# Patient Record
Sex: Male | Born: 1970 | Race: White | Hispanic: No | Smoking: Current every day smoker
Health system: Southern US, Community
[De-identification: ages and names within clinical notes are randomized; demographics above are authoritative.]

## PROBLEM LIST (undated history)

## (undated) DIAGNOSIS — E785 Hyperlipidemia, unspecified: Secondary | ICD-10-CM

## (undated) DIAGNOSIS — I219 Acute myocardial infarction, unspecified: Secondary | ICD-10-CM

## (undated) DIAGNOSIS — K219 Gastro-esophageal reflux disease without esophagitis: Secondary | ICD-10-CM

## (undated) HISTORY — PX: CORONARY ANGIOPLASTY WITH STENT PLACEMENT: SHX49

## (undated) HISTORY — PX: OTHER SURGICAL HISTORY: SHX169

## (undated) HISTORY — PX: URINARY SURGERY: SHX2626

---

## 2013-07-22 ENCOUNTER — Encounter: Payer: Self-pay | Admitting: Gastroenterology

## 2013-08-17 ENCOUNTER — Encounter: Payer: Self-pay | Admitting: Gastroenterology

## 2013-08-17 ENCOUNTER — Ambulatory Visit (INDEPENDENT_AMBULATORY_CARE_PROVIDER_SITE_OTHER): Payer: BC Managed Care – PPO | Admitting: Gastroenterology

## 2013-08-17 ENCOUNTER — Other Ambulatory Visit (INDEPENDENT_AMBULATORY_CARE_PROVIDER_SITE_OTHER): Payer: BC Managed Care – PPO

## 2013-08-17 VITALS — BP 120/60 | HR 88 | Ht 68.0 in | Wt 170.8 lb

## 2013-08-17 DIAGNOSIS — R112 Nausea with vomiting, unspecified: Secondary | ICD-10-CM

## 2013-08-17 LAB — CBC WITH DIFFERENTIAL/PLATELET
Basophils Absolute: 0 10*3/uL (ref 0.0–0.1)
Basophils Relative: 0.1 % (ref 0.0–3.0)
Eosinophils Relative: 1.7 % (ref 0.0–5.0)
Hemoglobin: 14.4 g/dL (ref 13.0–17.0)
Lymphocytes Relative: 28.5 % (ref 12.0–46.0)
Monocytes Relative: 5.6 % (ref 3.0–12.0)
Neutro Abs: 4.9 10*3/uL (ref 1.4–7.7)
RBC: 4.74 Mil/uL (ref 4.22–5.81)
WBC: 7.6 10*3/uL (ref 4.5–10.5)

## 2013-08-17 LAB — COMPREHENSIVE METABOLIC PANEL
ALT: 21 U/L (ref 0–53)
BUN: 15 mg/dL (ref 6–23)
CO2: 27 mEq/L (ref 19–32)
Calcium: 9.6 mg/dL (ref 8.4–10.5)
Chloride: 103 mEq/L (ref 96–112)
Creatinine, Ser: 0.9 mg/dL (ref 0.4–1.5)
GFR: 103.06 mL/min (ref >60.00)
Glucose, Bld: 106 mg/dL — ABNORMAL HIGH (ref 70–99)

## 2013-08-17 NOTE — Patient Instructions (Addendum)
You will be set up for an upper endoscopy for your vomiting. You will have labs checked today in the basement lab.  Please head down after you check out with the front desk  (cbc, cmet). One of your biggest health concerns is your smoking.  This increases your risk for most cancers and serious cardiovascular diseases such as strokes, heart attacks.  You should try your best to stop.  If you need assistance, please contact your PCP or Smoking Cessation Class at [facility] [phone]) or [facility] Quit-Line (1-800-QUIT-NOW).                                              We are excited to introduce MyChart, a new best-in-class service that provides you online access to important information in your electronic medical record. We want to make it easier for you to view your health information - all in one secure location - when and where you need it. We expect MyChart will enhance the quality of care and service we provide.  When you register for MyChart, you can:    View your test results.    Request appointments and receive appointment reminders via email.    Request medication renewals.    View your medical history, allergies, medications and immunizations.    Communicate with your physician's office through a password-protected site.    Conveniently print information such as your medication lists.  To find out if MyChart is right for you, please talk to a member of our clinical staff today. We will gladly answer your questions about this free health and wellness tool.  If you are age 43 or older and want a member of your family to have access to your record, you must provide written consent by completing a proxy form available at our office. Please speak to our clinical staff about guidelines regarding accounts for patients younger than age 43.  As you activate your MyChart account and need any technical assistance, please call the MyChart technical support line at [phone]-CHART  7266149167) or email your question to [email or web address]. If you email your question(s), please include your name, a return phone number and the best time to reach you.  If you have non-urgent health-related questions, you can send a message to our office through MyChart at [email or web address]. If you have a medical emergency, call 911.  Thank you for using MyChart as your new health and wellness resource!   MyChart licensed from [employer],  4782-9562. Patents Pending.

## 2013-08-17 NOTE — Progress Notes (Signed)
HPI: This is a  very pleasant 42 year old man whom I am meeting for the first time today.  For the past 4 months, food not digesting. Will vomit about 15-30 min after eating.  Nearly every meal.    Sometimes a solid food dysphagia.    Not losing weight.    No abdominal pains.   Does have burning, pyrosis.  He tried OTC nexium 1 pill daily for 2 weeks and it made no difference.  BID h2 blocker didn't help.  Pretty rere (twice monthly) NSAIDs.  12 pack beer in a week.    Review of systems: Pertinent positive and negative review of systems were noted in the above HPI section. Complete review of systems was performed and was otherwise normal.    History reviewed. No pertinent past medical history.  Past Surgical History  Procedure Laterality Date  . Knee sugery    . Urinary surgery      No current outpatient prescriptions on file.   No current facility-administered medications for this visit.    Allergies as of 08/17/2013  . (No Known Allergies)    Family History  Problem Relation Age of Onset  . Diabetes    . Lung cancer      History   Social History  . Marital Status: Married    Spouse Name: N/A    Number of Children: N/A  . Years of Education: N/A   Occupational History  . truck driver    Social History Main Topics  . Smoking status: Current Every Day Smoker  . Smokeless tobacco: Never Used     Comment: no  . Alcohol Use: Yes  . Drug Use: No  . Sexual Activity: Not on file   Other Topics Concern  . Not on file   Social History Narrative  . No narrative on file       Physical Exam: Ht 5\' 8"  (1.727 m)  Wt 170 lb 12.8 oz (77.474 kg)  BMI 25.98 kg/m2 Constitutional: generally well-appearing Psychiatric: alert and oriented x3 Eyes: extraocular movements intact Mouth: oral pharynx moist, no lesions Neck: supple no lymphadenopathy Cardiovascular: heart regular rate and rhythm Lungs: clear to auscultation bilaterally Abdomen: soft,  nontender, nondistended, no obvious ascites, no peritoneal signs, normal bowel sounds Extremities: no lower extremity edema bilaterally Skin: no lesions on visible extremities    Assessment and plan: 42 y.o. male with  postprandial vomiting, GERD-like burning in his esophagus  He tried proton pump inhibitor for 2 weeks without any improvement in his symptoms. It is not clear that acid is his only issue or even if it is an issue at all for him. For showing is not losing weight. I like to proceed with EGD at his soonest convenience. She will also get a basic set of labs to include CBC and complete about profile.

## 2013-08-25 ENCOUNTER — Encounter: Payer: Self-pay | Admitting: Gastroenterology

## 2013-08-25 ENCOUNTER — Ambulatory Visit (AMBULATORY_SURGERY_CENTER): Payer: BC Managed Care – PPO | Admitting: Gastroenterology

## 2013-08-25 VITALS — BP 104/67 | HR 47 | Temp 96.2°F | Resp 13 | Ht 68.0 in | Wt 170.0 lb

## 2013-08-25 DIAGNOSIS — K2970 Gastritis, unspecified, without bleeding: Secondary | ICD-10-CM

## 2013-08-25 DIAGNOSIS — R112 Nausea with vomiting, unspecified: Secondary | ICD-10-CM

## 2013-08-25 DIAGNOSIS — K2990 Gastroduodenitis, unspecified, without bleeding: Secondary | ICD-10-CM

## 2013-08-25 MED ORDER — SODIUM CHLORIDE 0.9 % IV SOLN: 500.0000 mL | INTRAVENOUS | Status: DC

## 2013-08-25 NOTE — Op Note (Signed)
[facility] [address] [place], [address]   ENDOSCOPY PROCEDURE REPORT  PATIENT: Robert, Norris  MR#: [id number] BIRTHDATE: 11/11/1971 , 42  yrs. old GENDER: Male ENDOSCOPIST: Rachael Fee, MD PROCEDURE DATE:  08/25/2013 PROCEDURE:  EGD w/ biopsy ASA CLASS:     Class II INDICATIONS:  intermittent nausea, vomiting. MEDICATIONS: Fentanyl 75 mcg IV, Versed 5 mg IV, and These medications were titrated to patient response per physician's verbal order TOPICAL ANESTHETIC: Cetacaine Spray  DESCRIPTION OF PROCEDURE: After the risks benefits and alternatives of the procedure were thoroughly explained, informed consent was obtained.  The LB [id number] endoscope was introduced through the mouth and advanced to the second portion of the duodenum. Without limitations.  The instrument was slowly withdrawn as the mucosa was fully examined.    There was mild distal gastritis and bulbar duodenitis.  The antrum was biopsied to check for H.  pylori.  The examination was otherwise normal.  Retroflexed views revealed no abnormalities. The scope was then withdrawn from the patient and the procedure completed. COMPLICATIONS: There were no complications.  ENDOSCOPIC IMPRESSION: There was mild distal gastritis and bulbar duodenitis.  The antrum was biopsied to check for H.  pylori.  The examination was otherwise normal.  RECOMMENDATIONS: If biopsies show H.  pylori, you will be started on appropriate antibiotics.   eSigned:  Rachael Fee, MD 08/25/2013 10:23 AM

## 2013-08-25 NOTE — Progress Notes (Signed)
Patient did not experience any of the following events: a burn prior to discharge; a fall within the facility; wrong site/side/patient/procedure/implant event; or a hospital transfer or hospital admission upon discharge from the facility. (G8907)Patient did not have preoperative order for IV antibiotic SSI prophylaxis. (G8918) ewm 

## 2013-08-25 NOTE — Patient Instructions (Addendum)
One of your biggest health concerns is your smoking.  This increases your risk for most cancers and serious cardiovascular diseases such as strokes, heart attacks.  You should try your best to stop.  If you need assistance, please contact your PCP or Smoking Cessation Class at [facility] [phone]) or [facility] Quit-Line (1-800-QUIT-NOW).  YOU HAD AN ENDOSCOPIC PROCEDURE TODAY AT THE [facility]: Refer to the procedure report that was given to you for any specific questions about what was found during the examination.  If the procedure report does not answer your questions, please call your gastroenterologist to clarify.  If you requested that your care partner not be given the details of your procedure findings, then the procedure report has been included in a sealed envelope for you to review at your convenience later.  YOU SHOULD EXPECT: Some feelings of bloating in the abdomen. Passage of more gas than usual.  Walking can help get rid of the air that was put into your GI tract during the procedure and reduce the bloating. If you had a lower endoscopy (such as a colonoscopy or flexible sigmoidoscopy) you may notice spotting of blood in your stool or on the toilet paper. If you underwent a bowel prep for your procedure, then you may not have a normal bowel movement for a few days.  DIET: Your first meal following the procedure should be a light meal and then it is ok to progress to your normal diet.  A half-sandwich or bowl of soup is an example of a good first meal.  Heavy or fried foods are harder to digest and may make you feel nauseous or bloated.  Likewise meals heavy in dairy and vegetables can cause extra gas to form and this can also increase the bloating.  Drink plenty of fluids but you should avoid alcoholic beverages for 24 hours.  ACTIVITY: Your care partner should take you home directly after the procedure.  You should plan to take it easy, moving slowly for the rest of  the day.  You can resume normal activity the day after the procedure however you should NOT DRIVE or use heavy machinery for 24 hours (because of the sedation medicines used during the test).    SYMPTOMS TO REPORT IMMEDIATELY: A gastroenterologist can be reached at any hour.  During normal business hours, 8:30 AM to 5:00 PM Monday through Friday, call [phone].  After hours and on weekends, please call the GI answering service at [phone] who will take a message and have the physician on call contact you.   Following upper endoscopy (EGD)  Vomiting of blood or coffee ground material  New chest pain or pain under the shoulder blades  Painful or persistently difficult swallowing  New shortness of breath  Fever of 100F or higher  Black, tarry-looking stools  FOLLOW UP: If any biopsies were taken you will be contacted by phone or by letter within the next 1-3 weeks.  Call your gastroenterologist if you have not heard about the biopsies in 3 weeks.  Our staff will call the home number listed on your records the next business day following your procedure to check on you and address any questions or concerns that you may have at that time regarding the information given to you following your procedure. This is a courtesy call and so if there is no answer at the home number and we have not heard from you through the emergency physician on call, we will assume  that you have returned to your regular daily activities without incident.  SIGNATURES/CONFIDENTIALITY: You and/or your care partner have signed paperwork which will be entered into your electronic medical record.  These signatures attest to the fact that that the information above on your After Visit Summary has been reviewed and is understood.  Full responsibility of the confidentiality of this discharge information lies with you and/or your care-partner.  Handout on gastritis

## 2013-08-26 ENCOUNTER — Telehealth: Payer: Self-pay | Admitting: *Deleted

## 2013-08-26 NOTE — Telephone Encounter (Signed)
  Follow up Call-  Call back number 08/25/2013  Post procedure Call Back phone  # [phone]  Permission to leave phone message Yes     Patient questions:  Do you have a fever, pain , or abdominal swelling? no Pain Score  0 *  Have you tolerated food without any problems? yes  Have you been able to return to your normal activities? yes  Do you have any questions about your discharge instructions: Diet   no Medications  no Follow up visit  no  Do you have questions or concerns about your Care? no  Actions: * If pain score is 4 or above: No action needed, pain <4.

## 2013-09-03 ENCOUNTER — Telehealth: Payer: Self-pay | Admitting: Gastroenterology

## 2013-09-03 NOTE — Telephone Encounter (Signed)
Notified pt that results have not been reviewed by Dr Christella Hartigan and he will be called as soon as available

## 2019-01-12 ENCOUNTER — Inpatient Hospital Stay (HOSPITAL_COMMUNITY)
Admission: EM | Admit: 2019-01-12 | Discharge: 2019-02-18 | DRG: 084 | Disposition: A | Discharge status: Home Health | Payer: BLUE CROSS/BLUE SHIELD | Attending: General Surgery | Admitting: General Surgery

## 2019-01-12 ENCOUNTER — Encounter: Payer: Self-pay | Admitting: General Surgery

## 2019-01-12 ENCOUNTER — Emergency Department (HOSPITAL_COMMUNITY): Payer: BLUE CROSS/BLUE SHIELD

## 2019-01-12 ENCOUNTER — Other Ambulatory Visit: Payer: Self-pay

## 2019-01-12 ENCOUNTER — Encounter (HOSPITAL_COMMUNITY): Payer: Self-pay

## 2019-01-12 DIAGNOSIS — R40243 Glasgow coma scale score 3-8, unspecified time: Secondary | ICD-10-CM | POA: Diagnosis present

## 2019-01-12 DIAGNOSIS — Z8249 Family history of ischemic heart disease and other diseases of the circulatory system: Secondary | ICD-10-CM | POA: Diagnosis not present

## 2019-01-12 DIAGNOSIS — S022XXA Fracture of nasal bones, initial encounter for closed fracture: Secondary | ICD-10-CM

## 2019-01-12 DIAGNOSIS — Z0189 Encounter for other specified special examinations: Secondary | ICD-10-CM

## 2019-01-12 DIAGNOSIS — H409 Unspecified glaucoma: Secondary | ICD-10-CM | POA: Diagnosis present

## 2019-01-12 DIAGNOSIS — Z955 Presence of coronary angioplasty implant and graft: Secondary | ICD-10-CM

## 2019-01-12 DIAGNOSIS — Z23 Encounter for immunization: Secondary | ICD-10-CM | POA: Diagnosis not present

## 2019-01-12 DIAGNOSIS — J9600 Acute respiratory failure, unspecified whether with hypoxia or hypercapnia: Secondary | ICD-10-CM

## 2019-01-12 DIAGNOSIS — T17908A Unspecified foreign body in respiratory tract, part unspecified causing other injury, initial encounter: Secondary | ICD-10-CM

## 2019-01-12 DIAGNOSIS — F1410 Cocaine abuse, uncomplicated: Secondary | ICD-10-CM | POA: Diagnosis present

## 2019-01-12 DIAGNOSIS — Z978 Presence of other specified devices: Secondary | ICD-10-CM

## 2019-01-12 DIAGNOSIS — R4781 Slurred speech: Secondary | ICD-10-CM | POA: Diagnosis present

## 2019-01-12 DIAGNOSIS — S065X0A Traumatic subdural hemorrhage without loss of consciousness, initial encounter: Secondary | ICD-10-CM | POA: Diagnosis not present

## 2019-01-12 DIAGNOSIS — S066X0A Traumatic subarachnoid hemorrhage without loss of consciousness, initial encounter: Secondary | ICD-10-CM | POA: Diagnosis not present

## 2019-01-12 DIAGNOSIS — M25532 Pain in left wrist: Secondary | ICD-10-CM | POA: Diagnosis present

## 2019-01-12 DIAGNOSIS — Z833 Family history of diabetes mellitus: Secondary | ICD-10-CM | POA: Diagnosis not present

## 2019-01-12 DIAGNOSIS — S065X9A Traumatic subdural hemorrhage with loss of consciousness of unspecified duration, initial encounter: Principal | ICD-10-CM | POA: Diagnosis present

## 2019-01-12 DIAGNOSIS — S0181XA Laceration without foreign body of other part of head, initial encounter: Secondary | ICD-10-CM

## 2019-01-12 DIAGNOSIS — I252 Old myocardial infarction: Secondary | ICD-10-CM | POA: Diagnosis not present

## 2019-01-12 DIAGNOSIS — T85598A Other mechanical complication of other gastrointestinal prosthetic devices, implants and grafts, initial encounter: Secondary | ICD-10-CM

## 2019-01-12 DIAGNOSIS — E785 Hyperlipidemia, unspecified: Secondary | ICD-10-CM | POA: Diagnosis present

## 2019-01-12 DIAGNOSIS — J40 Bronchitis, not specified as acute or chronic: Secondary | ICD-10-CM | POA: Diagnosis not present

## 2019-01-12 DIAGNOSIS — F1010 Alcohol abuse, uncomplicated: Secondary | ICD-10-CM | POA: Diagnosis present

## 2019-01-12 DIAGNOSIS — R402412 Glasgow coma scale score 13-15, at arrival to emergency department: Secondary | ICD-10-CM | POA: Diagnosis present

## 2019-01-12 DIAGNOSIS — K219 Gastro-esophageal reflux disease without esophagitis: Secondary | ICD-10-CM | POA: Diagnosis present

## 2019-01-12 DIAGNOSIS — Z4659 Encounter for fitting and adjustment of other gastrointestinal appliance and device: Secondary | ICD-10-CM

## 2019-01-12 DIAGNOSIS — J9690 Respiratory failure, unspecified, unspecified whether with hypoxia or hypercapnia: Secondary | ICD-10-CM

## 2019-01-12 DIAGNOSIS — R1310 Dysphagia, unspecified: Secondary | ICD-10-CM | POA: Diagnosis not present

## 2019-01-12 DIAGNOSIS — S069X9A Unspecified intracranial injury with loss of consciousness of unspecified duration, initial encounter: Secondary | ICD-10-CM | POA: Diagnosis not present

## 2019-01-12 DIAGNOSIS — S06300A Unspecified focal traumatic brain injury without loss of consciousness, initial encounter: Secondary | ICD-10-CM

## 2019-01-12 DIAGNOSIS — Y92410 Unspecified street and highway as the place of occurrence of the external cause: Secondary | ICD-10-CM

## 2019-01-12 DIAGNOSIS — Y908 Blood alcohol level of 240 mg/100 ml or more: Secondary | ICD-10-CM | POA: Diagnosis present

## 2019-01-12 DIAGNOSIS — S06360A Traumatic hemorrhage of cerebrum, unspecified, without loss of consciousness, initial encounter: Secondary | ICD-10-CM | POA: Diagnosis not present

## 2019-01-12 DIAGNOSIS — S60519A Abrasion of unspecified hand, initial encounter: Secondary | ICD-10-CM | POA: Diagnosis present

## 2019-01-12 DIAGNOSIS — Z800 Family history of malignant neoplasm of digestive organs: Secondary | ICD-10-CM

## 2019-01-12 DIAGNOSIS — R509 Fever, unspecified: Secondary | ICD-10-CM

## 2019-01-12 DIAGNOSIS — F10920 Alcohol use, unspecified with intoxication, uncomplicated: Secondary | ICD-10-CM

## 2019-01-12 HISTORY — DX: Gastro-esophageal reflux disease without esophagitis: K21.9

## 2019-01-12 HISTORY — DX: Hyperlipidemia, unspecified: E78.5

## 2019-01-12 HISTORY — DX: Acute myocardial infarction, unspecified: I21.9

## 2019-01-12 LAB — COMPREHENSIVE METABOLIC PANEL
ALT: 20 U/L (ref 0–44)
AST: 23 U/L (ref 15–41)
Albumin: 4.1 g/dL (ref 3.5–5.0)
Alkaline Phosphatase: 51 U/L (ref 38–126)
Anion gap: 11 (ref 5–15)
BUN: 9 mg/dL (ref 6–20)
CALCIUM: 8.9 mg/dL (ref 8.9–10.3)
CO2: 24 mmol/L (ref 22–32)
Chloride: 106 mmol/L (ref 98–111)
Creatinine, Ser: 0.92 mg/dL (ref 0.61–1.24)
GFR calc Af Amer: >60 mL/min (ref >60)
GFR calc non Af Amer: >60 mL/min (ref >60)
Glucose, Bld: 137 mg/dL — ABNORMAL HIGH (ref 70–99)
Potassium: 3.9 mmol/L (ref 3.5–5.1)
Sodium: 141 mmol/L (ref 135–145)
TOTAL PROTEIN: 7.1 g/dL (ref 6.5–8.1)
Total Bilirubin: 0.6 mg/dL (ref 0.3–1.2)

## 2019-01-12 LAB — PREPARE FRESH FROZEN PLASMA
Unit division: 0
Unit division: 0

## 2019-01-12 LAB — BASIC METABOLIC PANEL
ANION GAP: 14 (ref 5–15)
BUN: 6 mg/dL (ref 6–20)
CHLORIDE: 102 mmol/L (ref 98–111)
CO2: 23 mmol/L (ref 22–32)
Calcium: 8.2 mg/dL — ABNORMAL LOW (ref 8.9–10.3)
Creatinine, Ser: 0.91 mg/dL (ref 0.61–1.24)
GFR calc Af Amer: >60 mL/min (ref >60)
GFR calc non Af Amer: >60 mL/min (ref >60)
Glucose, Bld: 105 mg/dL — ABNORMAL HIGH (ref 70–99)
POTASSIUM: 4.4 mmol/L (ref 3.5–5.1)
Sodium: 139 mmol/L (ref 135–145)

## 2019-01-12 LAB — CBC
HCT: 48.1 % (ref 39.0–52.0)
HCT: 43.3 % (ref 39.0–52.0)
Hemoglobin: 15.9 g/dL (ref 13.0–17.0)
Hemoglobin: 15 g/dL (ref 13.0–17.0)
MCH: 30.9 pg (ref 26.0–34.0)
MCH: 32 pg (ref 26.0–34.0)
MCHC: 33.1 g/dL (ref 30.0–36.0)
MCHC: 34.6 g/dL (ref 30.0–36.0)
MCV: 93.4 fL (ref 80.0–100.0)
MCV: 92.3 fL (ref 80.0–100.0)
Platelets: 275 10*3/uL (ref 150–400)
Platelets: 258 10*3/uL (ref 150–400)
RBC: 5.15 MIL/uL (ref 4.22–5.81)
RBC: 4.69 MIL/uL (ref 4.22–5.81)
RDW: 13.2 % (ref 11.5–15.5)
RDW: 13.8 % (ref 11.5–15.5)
WBC: 10.6 10*3/uL — ABNORMAL HIGH (ref 4.0–10.5)
WBC: 25.3 10*3/uL — ABNORMAL HIGH (ref 4.0–10.5)
nRBC: 0 % (ref 0.0–0.2)
nRBC: 0 % (ref 0.0–0.2)

## 2019-01-12 LAB — BPAM FFP
BLOOD PRODUCT EXPIRATION DATE: 202001282359
Blood Product Expiration Date: 202001282359
ISSUE DATE / TIME: 202001130038
ISSUE DATE / TIME: 202001130038
Unit Type and Rh: 600
Unit Type and Rh: 6200

## 2019-01-12 LAB — BPAM RBC
Blood Product Expiration Date: 202001312359
Blood Product Expiration Date: 202001312359
ISSUE DATE / TIME: 202001130038
ISSUE DATE / TIME: 202001130038
Unit Type and Rh: 9500
Unit Type and Rh: 9500

## 2019-01-12 LAB — TYPE AND SCREEN
ABO/RH(D): A NEG
Antibody Screen: POSITIVE
DAT, IgG: NEGATIVE
PT AG Type: NEGATIVE
Unit division: 0
Unit division: 0

## 2019-01-12 LAB — I-STAT CHEM 8, ED
BUN: 10 mg/dL (ref 6–20)
Calcium, Ion: 1.11 mmol/L — ABNORMAL LOW (ref 1.15–1.40)
Chloride: 105 mmol/L (ref 98–111)
Creatinine, Ser: 1.3 mg/dL — ABNORMAL HIGH (ref 0.61–1.24)
Glucose, Bld: 132 mg/dL — ABNORMAL HIGH (ref 70–99)
HEMATOCRIT: 48 % (ref 39.0–52.0)
Hemoglobin: 16.3 g/dL (ref 13.0–17.0)
Potassium: 3.8 mmol/L (ref 3.5–5.1)
Sodium: 142 mmol/L (ref 135–145)
TCO2: 26 mmol/L (ref 22–32)

## 2019-01-12 LAB — HIV ANTIBODY (ROUTINE TESTING W REFLEX): HIV Screen 4th Generation wRfx: NONREACTIVE

## 2019-01-12 LAB — PROTIME-INR
INR: 0.9
PROTHROMBIN TIME: 12.1 s (ref 11.4–15.2)

## 2019-01-12 LAB — BLOOD PRODUCT ORDER (VERBAL) VERIFICATION

## 2019-01-12 LAB — RAPID URINE DRUG SCREEN, HOSP PERFORMED
Amphetamines: NOT DETECTED
Barbiturates: NOT DETECTED
Benzodiazepines: NOT DETECTED
Cocaine: POSITIVE — AB
Opiates: POSITIVE — AB
Tetrahydrocannabinol: NOT DETECTED

## 2019-01-12 LAB — ETHANOL: ALCOHOL ETHYL (B): 280 mg/dL — AB (ref <10)

## 2019-01-12 MED ORDER — TETANUS-DIPHTH-ACELL PERTUSSIS 5-2.5-18.5 LF-MCG/0.5 IM SUSP
0.5000 mL | Freq: Once | INTRAMUSCULAR | Status: AC
  Administered 2019-01-12: 0.5 mL via INTRAMUSCULAR
  Filled 2019-01-12: qty 0.5

## 2019-01-12 MED ORDER — VITAMIN B-1 100 MG PO TABS
100.0000 mg | ORAL_TABLET | Freq: Every day | ORAL | Status: DC
  Administered 2019-01-15 – 2019-01-16 (×2): 100 mg via ORAL
  Filled 2019-01-12 (×2): qty 1

## 2019-01-12 MED ORDER — THIAMINE HCL 100 MG/ML IJ SOLN
100.0000 mg | Freq: Every day | INTRAMUSCULAR | Status: DC
  Administered 2019-01-12 – 2019-01-14 (×3): 100 mg via INTRAVENOUS
  Filled 2019-01-12 (×4): qty 2

## 2019-01-12 MED ORDER — LORAZEPAM 2 MG/ML IJ SOLN
0.0000 mg | Freq: Four times a day (QID) | INTRAMUSCULAR | Status: AC
  Administered 2019-01-13 – 2019-01-14 (×3): 1 mg via INTRAVENOUS
  Filled 2019-01-12 (×5): qty 1

## 2019-01-12 MED ORDER — MORPHINE SULFATE (PF) 2 MG/ML IV SOLN
2.0000 mg | INTRAVENOUS | Status: DC | PRN
  Administered 2019-01-12 – 2019-01-27 (×10): 2 mg via INTRAVENOUS
  Filled 2019-01-12 (×10): qty 1

## 2019-01-12 MED ORDER — ADULT MULTIVITAMIN W/MINERALS CH
1.0000 | ORAL_TABLET | Freq: Every day | ORAL | Status: DC
  Administered 2019-01-15 – 2019-01-16 (×2): 1 via ORAL
  Filled 2019-01-12 (×2): qty 1

## 2019-01-12 MED ORDER — IOHEXOL 300 MG/ML  SOLN
100.0000 mL | Freq: Once | INTRAMUSCULAR | Status: AC | PRN
  Administered 2019-01-12: 100 mL via INTRAVENOUS

## 2019-01-12 MED ORDER — LORAZEPAM 2 MG/ML IJ SOLN
0.0000 mg | Freq: Two times a day (BID) | INTRAMUSCULAR | Status: AC
  Administered 2019-01-14 (×2): 1 mg via INTRAVENOUS
  Administered 2019-01-15 (×2): 2 mg via INTRAVENOUS
  Filled 2019-01-12 (×4): qty 1

## 2019-01-12 MED ORDER — LORAZEPAM 1 MG PO TABS: 1.0000 mg | ORAL_TABLET | Freq: Four times a day (QID) | ORAL | Status: AC | PRN

## 2019-01-12 MED ORDER — LORAZEPAM 2 MG/ML IJ SOLN
1.0000 mg | Freq: Four times a day (QID) | INTRAMUSCULAR | Status: AC | PRN
  Administered 2019-01-12 – 2019-01-15 (×4): 1 mg via INTRAVENOUS
  Filled 2019-01-12 (×2): qty 1

## 2019-01-12 MED ORDER — FOLIC ACID 1 MG PO TABS
1.0000 mg | ORAL_TABLET | Freq: Every day | ORAL | Status: DC
  Administered 2019-01-15 – 2019-01-16 (×2): 1 mg via ORAL
  Filled 2019-01-12 (×3): qty 1

## 2019-01-12 MED ORDER — HYDRALAZINE HCL 20 MG/ML IJ SOLN: 10.0000 mg | INTRAMUSCULAR | Status: DC | PRN

## 2019-01-12 MED ORDER — BISACODYL 10 MG RE SUPP
10.0000 mg | Freq: Every day | RECTAL | Status: DC | PRN
  Administered 2019-01-19: 10 mg via RECTAL
  Filled 2019-01-12 (×3): qty 1

## 2019-01-12 MED ORDER — SODIUM CHLORIDE 0.9 % IV BOLUS (SEPSIS)
1000.0000 mL | Freq: Once | INTRAVENOUS | Status: AC
  Administered 2019-01-12: 1000 mL via INTRAVENOUS

## 2019-01-12 MED ORDER — BACITRACIN ZINC 500 UNIT/GM EX OINT
TOPICAL_OINTMENT | Freq: Two times a day (BID) | CUTANEOUS | Status: DC
  Administered 2019-01-13 – 2019-01-19 (×13): 31.5556 via TOPICAL
  Administered 2019-01-19: 23:00:00 via TOPICAL
  Administered 2019-01-20: 31.5556 via TOPICAL
  Administered 2019-01-20: 22:00:00 via TOPICAL
  Administered 2019-01-21 (×2): 31.5556 via TOPICAL
  Administered 2019-01-22 (×2): 1 via TOPICAL
  Administered 2019-01-23 – 2019-01-24 (×4): via TOPICAL
  Administered 2019-01-25: 1 via TOPICAL
  Administered 2019-01-25 – 2019-02-08 (×28): via TOPICAL
  Administered 2019-02-08: 1 via TOPICAL
  Administered 2019-02-09 – 2019-02-18 (×14): via TOPICAL
  Filled 2019-01-12 (×3): qty 28.4

## 2019-01-12 MED ORDER — SODIUM CHLORIDE 0.9 % IV SOLN
INTRAVENOUS | Status: DC
  Administered 2019-01-12 (×2): via INTRAVENOUS

## 2019-01-12 MED ORDER — SODIUM CHLORIDE 0.9 % IV BOLUS
1000.0000 mL | Freq: Once | INTRAVENOUS | Status: AC
  Administered 2019-01-12: 1000 mL via INTRAVENOUS

## 2019-01-12 MED ORDER — ONDANSETRON 4 MG PO TBDP
4.0000 mg | ORAL_TABLET | Freq: Four times a day (QID) | ORAL | Status: DC | PRN
  Administered 2019-02-05: 4 mg via ORAL
  Filled 2019-01-12: qty 1

## 2019-01-12 MED ORDER — LORAZEPAM 2 MG/ML IJ SOLN
1.0000 mg | Freq: Once | INTRAMUSCULAR | Status: AC
  Administered 2019-01-12: 1 mg via INTRAVENOUS

## 2019-01-12 MED ORDER — ACETAMINOPHEN 650 MG RE SUPP
650.0000 mg | RECTAL | Status: DC | PRN
  Administered 2019-01-12 – 2019-01-20 (×2): 650 mg via RECTAL
  Filled 2019-01-12: qty 1

## 2019-01-12 MED ORDER — ONDANSETRON HCL 4 MG/2ML IJ SOLN: 4.0000 mg | Freq: Four times a day (QID) | INTRAMUSCULAR | Status: DC | PRN

## 2019-01-12 MED ORDER — SODIUM CHLORIDE 0.9 % IV SOLN
INTRAVENOUS | Status: DC
  Administered 2019-01-12: 03:00:00 via INTRAVENOUS

## 2019-01-12 MED ORDER — LORAZEPAM 2 MG/ML IJ SOLN
INTRAMUSCULAR | Status: AC
  Filled 2019-01-12: qty 1

## 2019-01-12 NOTE — Progress Notes (Signed)
PT Cancellation Note  Patient Details Name: Robert Norris MRN: [id number] DOB: 01-08-1971   Cancelled Treatment:    Reason Eval/Treat Not Completed: Other (comment)(pt lethargic after medication, discussed with RN and will hold and initiate therapy next date)   Rewa Weissberg B Cathlyn Tersigni 01/12/2019, 1:17 PM Kamaryn Grimley Abner Greenspan, PT Acute Rehabilitation Services Pager: [phone] Office: [phone]
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
## 2019-01-12 NOTE — ED Notes (Signed)
Wallet and cell phone locked up with security. 

## 2019-01-12 NOTE — ED Notes (Signed)
Two pt valuables envelopes ([id number] & [id number]) placed with Security. Receipts placed in belongings bag with rest of pt's clothes.

## 2019-01-12 NOTE — Progress Notes (Signed)
SLP Cancellation Note  Patient Details Name: Robert Norris MRN: [id number] DOB: [birth year]   Cancelled treatment:       Reason Eval/Treat Not Completed: Patient's level of consciousness; orders received for swallow and TBI evals.  D/W RN; will follow for readiness.    [name], MA CCC/SLP Acute Rehabilitation Services Office number [phone] Pager [phone]  [name] 01/12/2019, 1:13 PM
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
## 2019-01-12 NOTE — Progress Notes (Signed)
RN found tourniquet on pt's right upper arm at 0845 that seemed to be fairly tight.  This seems to be from lab draws at 0615 this morning.  RN released tourniquet.  Pt's arm somewhat cool to the touch but pt has a +2 radial pulse.  RN made Dr. Janee Morn aware of this.

## 2019-01-12 NOTE — Consult Note (Signed)
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 injury.  Plan on reduction when able which could be outpatient if timing works out that way.  I have requested the nurse clean his face as able with his agitation.  I am available to help as well.  Will monitor. Follow up in one week if discharged soon.  Alena BillsClaire S Latania Bascomb 01/12/2019, 12:38 PM
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
## 2019-01-12 NOTE — Progress Notes (Signed)
Patient ID: Robert Norris, male   DOB: 04/25/1971, 48 y.o.   MRN: [id number] I met with his mother, sister, son, and other family members. I updated them on his condition and plan of care. They report he has been an alcoholic for a long time and his son especially expressed frustration with his behavior.  Georganna Skeans, MD, MPH, FACS Trauma: [phone] General Surgery: [phone]
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
## 2019-01-13 ENCOUNTER — Inpatient Hospital Stay (HOSPITAL_COMMUNITY): Payer: BLUE CROSS/BLUE SHIELD

## 2019-01-13 LAB — CBC
HCT: 40.4 % (ref 39.0–52.0)
Hemoglobin: 13.6 g/dL (ref 13.0–17.0)
MCH: 30.7 pg (ref 26.0–34.0)
MCHC: 33.7 g/dL (ref 30.0–36.0)
MCV: 91.2 fL (ref 80.0–100.0)
NRBC: 0 % (ref 0.0–0.2)
Platelets: 204 10*3/uL (ref 150–400)
RBC: 4.43 MIL/uL (ref 4.22–5.81)
RDW: 13.6 % (ref 11.5–15.5)
WBC: 18.6 10*3/uL — AB (ref 4.0–10.5)

## 2019-01-13 LAB — BASIC METABOLIC PANEL
Anion gap: 10 (ref 5–15)
BUN: 8 mg/dL (ref 6–20)
CO2: 25 mmol/L (ref 22–32)
Calcium: 8.5 mg/dL — ABNORMAL LOW (ref 8.9–10.3)
Chloride: 102 mmol/L (ref 98–111)
Creatinine, Ser: 0.89 mg/dL (ref 0.61–1.24)
GFR calc Af Amer: >60 mL/min (ref >60)
GFR calc non Af Amer: >60 mL/min (ref >60)
Glucose, Bld: 119 mg/dL — ABNORMAL HIGH (ref 70–99)
Potassium: 3.5 mmol/L (ref 3.5–5.1)
SODIUM: 137 mmol/L (ref 135–145)

## 2019-01-13 MED ORDER — FENTANYL CITRATE (PF) 100 MCG/2ML IJ SOLN
25.0000 ug | INTRAMUSCULAR | Status: DC | PRN
  Administered 2019-01-15 – 2019-01-16 (×4): 50 ug via INTRAVENOUS
  Filled 2019-01-13 (×4): qty 2

## 2019-01-13 MED ORDER — CHLORHEXIDINE GLUCONATE 0.12 % MT SOLN
15.0000 mL | Freq: Two times a day (BID) | OROMUCOSAL | Status: DC
  Administered 2019-01-13 – 2019-02-09 (×53): 15 mL via OROMUCOSAL
  Filled 2019-01-13 (×45): qty 15

## 2019-01-13 MED ORDER — SODIUM CHLORIDE 0.9 % IV SOLN: INTRAVENOUS | Status: DC

## 2019-01-13 MED ORDER — ORAL CARE MOUTH RINSE
15.0000 mL | Freq: Two times a day (BID) | OROMUCOSAL | Status: DC
  Administered 2019-01-13 – 2019-02-17 (×51): 15 mL via OROMUCOSAL

## 2019-01-13 MED ORDER — POTASSIUM CHLORIDE IN NACL 20-0.9 MEQ/L-% IV SOLN
INTRAVENOUS | Status: DC
  Administered 2019-01-13 – 2019-01-17 (×8): via INTRAVENOUS
  Filled 2019-01-13 (×10): qty 1000
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
## 2019-01-13 NOTE — Progress Notes (Addendum)
Rehab Admissions Coordinator Note:  Patient was screened by Clois Dupes for appropriateness for an Inpatient Acute Rehab Consult OT and SLP recs. We will follow along as we await further participation abilities with therapy before determining rehab venue options.Clois Dupes RN MSN 01/13/2019, 3:11 PM  I can be reached at [phone].
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
## 2019-01-14 LAB — BASIC METABOLIC PANEL
Anion gap: 11 (ref 5–15)
BUN: 7 mg/dL (ref 6–20)
CO2: 23 mmol/L (ref 22–32)
Calcium: 8.7 mg/dL — ABNORMAL LOW (ref 8.9–10.3)
Chloride: 105 mmol/L (ref 98–111)
Creatinine, Ser: 0.73 mg/dL (ref 0.61–1.24)
GFR calc Af Amer: >60 mL/min (ref >60)
GFR calc non Af Amer: >60 mL/min (ref >60)
Glucose, Bld: 101 mg/dL — ABNORMAL HIGH (ref 70–99)
POTASSIUM: 3.8 mmol/L (ref 3.5–5.1)
Sodium: 139 mmol/L (ref 135–145)
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
## 2019-01-15 ENCOUNTER — Inpatient Hospital Stay (HOSPITAL_COMMUNITY): Payer: BLUE CROSS/BLUE SHIELD

## 2019-01-15 LAB — CBC WITH DIFFERENTIAL/PLATELET
ABS IMMATURE GRANULOCYTES: 0.05 10*3/uL (ref 0.00–0.07)
Basophils Absolute: 0 10*3/uL (ref 0.0–0.1)
Basophils Relative: 0 %
Eosinophils Absolute: 0 10*3/uL (ref 0.0–0.5)
Eosinophils Relative: 0 %
HCT: 42.3 % (ref 39.0–52.0)
Hemoglobin: 14.6 g/dL (ref 13.0–17.0)
Immature Granulocytes: 1 %
Lymphocytes Relative: 10 %
Lymphs Abs: 1.1 10*3/uL (ref 0.7–4.0)
MCH: 31.7 pg (ref 26.0–34.0)
MCHC: 34.5 g/dL (ref 30.0–36.0)
MCV: 92 fL (ref 80.0–100.0)
MONOS PCT: 7 %
Monocytes Absolute: 0.8 10*3/uL (ref 0.1–1.0)
Neutro Abs: 8.9 10*3/uL — ABNORMAL HIGH (ref 1.7–7.7)
Neutrophils Relative %: 82 %
Platelets: 231 10*3/uL (ref 150–400)
RBC: 4.6 MIL/uL (ref 4.22–5.81)
RDW: 13.3 % (ref 11.5–15.5)
WBC: 10.9 10*3/uL — ABNORMAL HIGH (ref 4.0–10.5)
nRBC: 0 % (ref 0.0–0.2)

## 2019-01-15 LAB — PHOSPHORUS
PHOSPHORUS: 3 mg/dL (ref 2.5–4.6)
Phosphorus: 3.2 mg/dL (ref 2.5–4.6)

## 2019-01-15 LAB — GLUCOSE, CAPILLARY
Glucose-Capillary: 77 mg/dL (ref 70–99)
Glucose-Capillary: 100 mg/dL — ABNORMAL HIGH (ref 70–99)
Glucose-Capillary: 100 mg/dL — ABNORMAL HIGH (ref 70–99)
Glucose-Capillary: 107 mg/dL — ABNORMAL HIGH (ref 70–99)

## 2019-01-15 LAB — BASIC METABOLIC PANEL
Anion gap: 10 (ref 5–15)
BUN: 8 mg/dL (ref 6–20)
CO2: 24 mmol/L (ref 22–32)
Calcium: 8.9 mg/dL (ref 8.9–10.3)
Chloride: 104 mmol/L (ref 98–111)
Creatinine, Ser: 0.77 mg/dL (ref 0.61–1.24)
GFR calc non Af Amer: >60 mL/min (ref >60)
Glucose, Bld: 91 mg/dL (ref 70–99)
Potassium: 4.2 mmol/L (ref 3.5–5.1)
SODIUM: 138 mmol/L (ref 135–145)

## 2019-01-15 LAB — MAGNESIUM
Magnesium: 1.9 mg/dL (ref 1.7–2.4)
Magnesium: 2 mg/dL (ref 1.7–2.4)

## 2019-01-15 MED ORDER — ACETAMINOPHEN 325 MG PO TABS
650.0000 mg | ORAL_TABLET | Freq: Four times a day (QID) | ORAL | Status: DC | PRN
  Administered 2019-01-15 – 2019-01-20 (×10): 650 mg via NASOGASTRIC
  Filled 2019-01-15 (×10): qty 2

## 2019-01-15 MED ORDER — VITAL HIGH PROTEIN PO LIQD: 1000.0000 mL | ORAL | Status: DC

## 2019-01-15 MED ORDER — PRO-STAT SUGAR FREE PO LIQD: 30.0000 mL | Freq: Two times a day (BID) | ORAL | Status: DC

## 2019-01-15 MED ORDER — ACETAMINOPHEN 325 MG PO TABS
650.0000 mg | ORAL_TABLET | Freq: Four times a day (QID) | ORAL | Status: DC | PRN
  Filled 2019-01-15: qty 2

## 2019-01-15 MED ORDER — PIVOT 1.5 CAL PO LIQD
1000.0000 mL | ORAL | Status: DC
  Administered 2019-01-15 – 2019-01-25 (×8): 1000 mL
  Filled 2019-01-15 (×12): qty 1000
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
## 2019-01-15 NOTE — Progress Notes (Signed)
Initial Nutrition Assessment  DOCUMENTATION CODES:   Not applicable  INTERVENTION:  No BM since admission; discussed with RN  Pt at risk for refeeding; recommend initiating  Pivot 1.5 @ 30mL. Advance 10mL every 8hrs until goal rate of 60mL/hr is achieved  At goal rate of 60mL/hr, Pivot provides 2157Joe30Dorist13Wynelle [place] [facility] [place] Placer SuZoila SGera531-411-7MarlenYLCa27rArLVeda CaGabSu1681OpticiLena45rdCrestlCarleene CoopePri29nLupKCol10orClCenterpointe HospitalKeaKentuckSh313-0Cheree DBrennan Baileye Ju661ErnestinLisabeth DPG43Gean BDu68Jo9sHectL7245 East ConstitutioKentuck42ynMKentuckyeL667-84643-Gwenev44Kosair 0Y5Lajoyce CornSioux Falls Veterans AffairsVidal12345410026(Feliz Beam97yr & Gambl30Joe41Dorist13Wynelle Pioneer MemBig Island EnZoila SGera4830-493-2MarlenYLowCa9rALVeda CaGabBlue Spri16(36OpticiLena87rdRocklCarleene CoopePri75nLupKCol22orClPinckneyville Community HospitalKeaKentuckSh820-7Cheree DBrennan Baileye Ju252ErnestinLisabeth DPG26Ge9905 HamiltoKentuck76yKentucky7L28109491Gwenev62Franklin E0Y5Lajoyce CornHancock Regional Sur64Vidal1785634Feliz Beam01yr & Gam80Joe65Dorist13Wynelle Fairview DeveloMclean HospZoila SGera6928-538-4MarlenYLoweLorCa28rALVeda CaGabBurkittsvi16(831OpticiLena4rdRivCarleene CoopePri69nLupKCol37orClMed Atlantic IncKeaKentuckSh802Cheree DBrennan Baileye Ju9633ErnestinLisabeth DPG56Gean BDu48Jo43sH9201 Pacific [place]54yDWisconsin Specialty [place]7L220 7033 Gwenev640St. Claire Regi0Y4Lajoyce CornHagerstown Sur72gDMVidal12354Joe90Dorist13Wynelle AllianceheaMidland SurgiZoila SGera4940-317-3MarlenYLowCa65rALVeda CaGabPrew16(669OpticiLena56rdHampCarleene CoopePri49nLupKCol16orClFairfax Behavioral Health MonroeKeaKentuckSh647-5Cheree DBrennan Baileye Ju6567ErnestinLisabeth DPG61Gean BDu27Jo57sH21 BrownKentuck58y Lakeland Community HoKentucky7L757-67763-Gwenev58Henderson H0Y5Lajoyce CornAuburn ComV66Joe1Dorist13Wynelle [facility] MZoila SGera89590556MarlenYLoweLCa69rALVeda CaGabMammoth Spr1697OpticiLena26rdBasking RiCarleene CoopePri55nLupKCol58orClSouthwest Fort Worth Endoscopy CenterKeaKentuK04.5Lajoyce Co81rDTun12386868632ErnestinLisabeth D7771 Saxon SKentuck20ytH. C. WatkinsKentucky2L469 72165 Gwenev112St Vincent Ra0Y1Lajoyce CornSuncoast BehavioVidal123456285(3Feliz Beam51yr & GambletLaPhilomenaDes175Joe24Dorist13Wynelle Winn Parish French HospitalZoila [phone] CaGabH. Rivera Co1650OpticiLena74rdPojoaCarleene CoopePri3nLupKCol100orClMidwest Digestive Health Center LLCKeaKentuckSh(367) 6Cheree DBrennan Baileye Ju1573ErnestinLisabe713 Rockaway SKentuck16ytBrighton SKentucky6L513 12965 Gwenev21Shamro0YLajoyce CornKeefe Me68Vidal1234567046(Feliz Beam25yr & GamblesHectLaPhilome123LeoRMWKern AlbertCharlArbour HospitalOnale1Joe36Dorist13Wynelle Rockingham MemBon Secours Memorial Regional Zoila SGera4218-324-0MarlenYLCa22rALVeda CaGabLeesb1681OpticiLena53rdSomerCarleene CoopePri68nLupKCol31orClCleveland Ambulatory Services LLCKeaKentuckSh([phone] DBrennan Baileye Ju492ErnestinLisabeth 3 HeleKentuck64ynBarklKentucky9L239-86156-Gwenev633[place]ree0Y10Lajoyce CornMeadville59 Vidal1234551556Feliz Beam51yr & GamblectLaPhilomen123Leo7MaderMWKern 336ErnestinLisabeth DPG7740 HarveyKentuck25y [place]1L214-83867-Gwenev218Riverview 0YLajoyce CornBay Area Endoscopy Center 25Joe90Dorist13Wynelle Trident Ambulatory SurChatham OrthopaedicZoila SGera3856-567-6MarlenYLoweLorin 4PCa72rArLVeda CaGabLan1646OpticiLena15rdJim FaCarleene CoopePri11nLupKCol60orClOakland Regional HospitalKeaKentuckSh613 5Cheree DBrennan Baileye Ju2351Ernes83[address] NicholKentuck16ysOakland PhysiKentucky5L203 65766 Gwenev61Montro0Y1Lajoyce Corn2Vidal1234524686(Feliz Beam23yr & GambleBDu77Jo74sHectLaPhilo123LMWKern AlbertCharlErlanger BlOnalee Hu<BADTE72Joe41Dorist13Wynelle [facility] Long Term AcuteBellin Orthopedic SuZoila SGera6903 782 9MarlenYLoweCa78rAr(LVeda CaGabReidsvi16(587OpticiLena12rdMedCarleene CoopePri13nLupKCol7orClMethodist Rehabilitation HospitalKeaKentuckSh(260) 5Cheree DBrennan Baileye Ju297ErnestinLisabeth DPG18G281 Purple FincKentuck38yhNew England Eye SKentucky2L5138341Gwenev659Boys Town National Resea0Y6Lajoyce CornForsyth Eye5553Joe91Dorist13Wynelle [facility] TreZoila SGera7330-511-1MarlenYLoweLorin Ca4rALVeda CaGabPymatuning So1678OpticiLena39rdKingston SpriCarleene CoopePri6nLupKCol26orClLewisburg Plastic Surgery And Laser CenterKeaKentuckSh435-8Cheree DBrennan Baileye Ju463ErnestinLisabeth DPG6Gean BDu55Jo7572 MadisonKentuck41y Lifecare HospitalsKentucky2L(585) 34781-Gwenev1011Surgical Eye C0Y3Lajoyce CornUniversity4Vidal1234843656Feliz Beam76yr & Gamble13Joe46Dorist13Wynelle [facility] - LeoZoila SGera5931-349-6MarlenYLoweLorinCa52rArLVeda CaGabChilcoot-Vin16(97OpticiLena28rdPlacCarleene CoopePri81nLupKCol46orClEndoscopy Center Of Inland Empire LLCKeaKentuckSh(310)5Cheree DBrennan Baileye Ju430ErnestinLisabeth DPG41Gean BDu18Jo592 HilltoKentuck98ypUniversity Hospitals RehabKentucky7L269-33338-Gwenev539Endoscopy Ce0Y6Lajoyce CornRockefeller Univ424Joe27Dorist13Wynelle The South BEndoscopy Zoila [phone] CaGabOtt16(21OpticiLena64rdAlgCarleene CoopePri68nLupKCol55orClCabinet Peaks Medical CenterKeaKentuckSh209-6Cheree DBrennan Baileye Ju689ErnestinLisabeth DPG33Gean BDu77Jo65sH4 [place]uck75yDIndiana University HeaKentucky2L564-32769-Gwenev6107Eye And Laser Surgery Center0Y6Lajoyce CornNovant Health Prince William7Vidal47834Feliz Beam05yr & Gambleern AlbertCharlEncompass Health724ErnestinLisabeth DPG9957 HillcrestKentuck73y Clermont AmbulatoKentucky5L([phone])3869-Gwenev13Madi0Y5Lajoyce CornBucks County54Joe94Dorist13Wynelle Porter-StarkEncompass Health Braintree RehabiliZoila SGera3364-570-0MarlenYLoweCa17rALVeda CaGabVal1650OpticiLena21rdBuena ViCarleene CoopePri77nLupKCol86orClAnna Jaques HospitalKeaKentuckSh210-8Cheree DBrennan Baileye Ju823ErnestinLis7622 WaterKentuck28y DigesKentucky2L716-32819-Gwenev684Ucsd Ambulatory0Y1Lajoyce CornCp SuVidal123464756Feliz Beam85yr & Gamble11sHectLaPhilom123Leo7WalM21Joe57Dorist13Wynelle King[facility] Continuecare HospZoila SGera2478-009-2MarlenYLowCa49LVeda CaGabSummit P16(80OpticiLena58rdGrafCarleene CoopePri61nLupKCol51orClKindred Hospital South PhiladeLPhiaKeaKentuckSh602Cheree DBrennan Baileye Ju8190ErnestinLisabeth DPG78Gea562 FoxruKentuck59ynChambersburg EnKentucky3L724-27615-Gwenev30Y6Lajoyce CornArbour Human ResVidal123496456Feliz Be19Joe29Dorist13Wynelle G And G IntAdventist MediZoila SGera1289-023-5MarlenYLoweLCa62rArLVeda CaGabPigg16OpticiLena73rdBrowerviCarleene CoopePri8nLupKCol78orClPeacehealth St. Joseph HospitalKeaKentuckSh952-7Cheree DBrennan Baileye Ju1641ErnestinLisabeth DPG47Gean BDu45Jo38sHectLaPhilome123LeMWKern87 S. CoopeKentuck30yrSt. Mary RegioKentucky6L210-46135-Gwe0Y3Lajoyce CornEaster68Joe33Dorist13Wynelle [facility]. Anthony HospitalProffer Zoila SGera6405-592-4MarlenYLoweLoriCa46rALVeda CaGabCartervi1672OpticiLena27rdKey CenCarleene CoopePri37nLupKCol13orClLakeview Regional Medical CenterKeaKentuckSh(970)4Cheree DBreKentuck58ynUniversity MediKentuc17kGweneShasta RegLajoyce CornNew England Sur57gVidal1236395446Joe88Dorist13Wynelle Uh CantonBingham MZoila [phone](LVeda CaGabTaft Heig1656OpticiLena33rdGranite HiCarleene CoopePri49nLupKCol45orClTuba City Regional Health CareKeaKentuckSh708-4Cheree DBrennan Baileye Ju8671ErnestinLisabeth DPG95Gean BDu40Jo69sHectLaPhilomenaSi123LSherMWKern Alber66 RedwoodKentuck65y John C StennisKentucky4L5408427Gwenev57W0Y1Lajoyce CornCoquille Valley Ho25Joe43Dorist13Wynelle Charlston Area [place] Veterans AffaiZoila SGera10862-662-6MarlenYLowCa2rAr(LVeda CaGabRichl16(770OpticiLena25rdNickerCarleene CoopePri36nLupKCol50orClBarstow Community HospitalKeaKentuckSh770-0Cheree DBrennan Baileye Ju3580ErnestinL8981 Sheffield SKentuck21ytStatKentucky3L([phone]-Gwenev46Surgery 0Y3Lajoyce CornMercy Sou67t19Joe64Dorist13Wynelle The Corp[facility] Medical CenOsf Saint LukZoila SGera4317-164-3MarlenYLoweLorCa13rAr(LVeda CaGabWentwo16OpticiLena25rdEdgewCarleene CoopePri66nLupKCol87orClWilliam J Mccord Adolescent Treatment FacilityKeaKentuckSh(332) 5Cheree DBrennan Baileye Ju752ErnestinLisabeth DPG59Gea93 Green HilKentuck44ylEye Surgery Center OKentucky3L502-18131-Gwenev26Texoma Va0Y6Lajoyce CornLakeland Surgical And Diagnostic Center LLP2Vidal123427456Feliz Beam61Joe64Dorist13Wynelle [facility] MethodZoila SGera7513-198-9MarlenYLoweLorin Ca5rALVeda CaGabAther16(66OpticiLena50rdClappertCarleene CoopePri35nLupKCol58orClUnity Medical CenterKeaKentuckSh905-7Cheree DBrennan Baileye Ju578ErnestinLisabeth DPG98Ge79 Brookside SKentuck52ytSKentucky5L50968220Gwenev74Eye Surgery Cent0Y7Lajoyce CornSparrow5634Joe3Dorist13Wynelle [facility] St Francis HStZoila SGera2507-362-5MarlenYLoweLoCa47rALVeda CaGabFruit C16(805OpticiLena76rdAmite CCarleene CoopePri30nLupKCol6orClHolton Community HospitalKeaKentuckSh(410) 7Cheree DBrennan Baileye Ju3347ErnestinLisabeth DPG88248 KinKentuck64ygZambaranoKentucky6L209-3296-Gwenev58Advanced Surgery Cen0Y1Lajoyce CornCooperstown775Joe42Dorist13Wynelle Tampa GeCanyonZoila SGera5239 146 3MarlenYLoweLorCa19rArLVeda CaGabSavan1676OpticiLena32rdDenmCarleene CoopePri39nLupKCol55orClMarion Il Va Medical CenterKeaKentuckSh(832) 6Cheree DBrennan Baileye Ju429ErnestinLisabe808 2nd Kentuck71yDLegacy Mount HKentucky4L([phone]-Gwenev53St. Jo0Y6Lajoyce CornDoctorVidal123489256Feliz Beam76yr & Gamble2527ErnestinLisabeth DPG51179 HudsoKentuck71ynOceans HosKentucky6L216-8132-Gwenev374Pri0Y4Lajoyce CornNew York-Presbyterian/La6419Joe63Dorist13Wynelle The Surgical Center OfCgs EndoscZoila SGera4254356MarlenYLoweCa69rALVeda CaGabSmoket1684OpticiLena40rdGodCarleene CoopePri52nLupKCol3orClHima San Pablo - FajardoKeaKentuckSh647-4Cheree DBrennan Baileye Ju3811ErnestinLisabeth DPG41Gean BDu9796 53rd SKentuck76ytCamc TeaKentucky5L647-37533-Gwenev34C0Y1Lajoyce CornGunders70eDS20Joe35Dorist13Wynelle [facility] - NeHarlingen SurgiZoila SGera650-884-4MarlenYLoweLoriCa3rALVeda CaGabHighland Heig1671OpticiLena93rdMaurCarleene CoopePri47nLupKCol68orClCapital City Surgery Center Of Florida LLCKeaKentuckSh434-0Cheree DBrennan Baileye Ju2800ErnestinLisabeth DPG67Gean BDu5163 Van DykKentuck75yeBullKentuckyoL(602) 57943-Gwenev857Brighton0YLajoyce CornSt Vincent9468 Ridge Kentuck76yDMissouri RehabilBerDo229-360-27152Southeast MichigY10Lajoyce CornSouthwest Florida Institute Of Am71Joe70Dorist13Wynelle [facility] Marina Zoila [phone] CaGabMinat1693OpticiLena74rdThermopoCarleene CoopePri75nLupKCol42orClRiver Crest HospitalKeaKentuckSh909Cheree DBrennan Baileye Ju1313ErnestinLisabeth DPG2Ge9108 Washington SKentuck46ytBlue BonneKentucky1L774-44135-Gwenev580Memorial Hermann T0Y10Lajoyce CornJackso4860Joe47Dorist13Wynelle Phys[facility] [place] SurgerZoila SGera10([phone]Veda CaGabWrang1633OpticiLena58rdOak GrCarleene CoopePri43nLupKCol52orClCarlsbad Surgery Center LLCKeaKentuckSh(830)4Cheree DBrennan Baileye Ju639ErnestinLisabeth DP567 Windfall Kentuc59kKentucky4L575-88524-Gwenev243Lam0Y3Lajoyce CornAdvanced Surgical Institute Dba South Jersey Musculoskeleta4Joe40Dorist13Wynelle Charlotte VcZoila SGera7364-320-0MarlenYLoweLorCa71rALVeda CaGabBucha1632OpticiLena24rdRockviCarleene CoopePri11nLupKCol45orClBroadwest Specialty Surgical Center LLCKeaKentuckSh205-3Cheree DBrennan Baileye JusttoOncologistyr & GambleLaPhilom12MWKern AlbertCharlWestern Missouri Medical COnalee Hu bdural along the falx.Pt positive for ETOH and cocaine   Daughter in room at visit and provided patient history. 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 When at home, he lives with his son. Daughter reports the patient eats well when at home and recalls 2 meals/day. Daughter reports patient has a long hx of EtOH abuse and unsure of how much he consumes or how often.   1/16: NG 10F- IMPRESSION: Nasogastric tube identified coiled in the fundus of stomach.  SLP Visit Diagnosis: Attention and concentration deficit;Frontal lobe and executive function deficit;Cognitive communication deficit: Inpatient rehab for 2 weeks  BP: 176/101 Temps: 97.3-98.8 (1/14-1/16)  I/O: +6392.1 ml since admit UOP: 2475 ml x 24 hrs  Medications reviewed and include: Folic acid, MVI, ThiRArtTheda BConsulting RPort LudTheda BConsulting civil engiCovenant HospiRCopTheda BConsultingRGlenaTheda BConsulting civil engiArc Worcester Center LRStreetTheda BConsultinRHawoTheda BConsulting civil engiMunson Medical CeRIdaTheda BConsulting civiRSavonbTheda BConsultingRLawreTheda BConsulting civiOTheda BConsulting civil engiReRBlandinsviTheda BConsulting civil engiHedwig Asc LLC Dba Houston PremiRNicolTheda BConsulting civil engiComRRiegelwTheda BConsulting civil engiDoctorsRNavaTheda BConsulting ciRCapuTheda BConsulting civil engiCarolinas HealthcarRJenniTheda BConsulRRidgemTheda BConsulting civil eRParkerfiTheda BConsulting civil engiNorthwestern Medicine MRShoTheda BConRCullTheda BConsRPonchatoTheda BConsulRHennTheda BConsulting civRSkylineConsROlney SpriTheda BConsulting civil engiRetina ConsRLittle FaTheda BConsulting civil engiReba Mcentire Center For Rehabilitatio40nneerst Mousery Cente4<MEASUREMPilar PGeorgia Cataract And Eye Specialty CenteElita Boo4 gion  Unable to assess [patient with multiple abrasions to face]  Upper Arm Region  No depletion  Thoracic and Lumbar Region  No depletion  Buccal Region  No depletion  Temple Region  No depletion  Clavicle Bone Region  No depletion  Clavicle and Acromion Bone Region  No depletion  Scapular Bone Region  No depletion  Dorsal Hand  Unable to assess [patient wearing mittens]  Patellar Region  No depletion  Anterior Thigh Region  No depletion  Posterior Calf Region  No depletion  Edema (RD Assessment)  None  Hair  Reviewed  Eyes  Unable to assess  Mouth  Unable to assess  Skin  Reviewed  Nails  Unable to assess       Diet Order:   Diet Order            Diet NPO time specified  Diet effective now              EDUCATION NEEDS:   Not appropriate for education at this time  Skin:  Skin Assessment: Reviewed RN Assessment(multiple abrasions; face)  Last BM:  unknown  Height:   Ht Readings from Last 1 Encounters:  01/12/19 5\' 9"  (1.753 m)    Weight:   Wt Readings from Last 1 Encounters:  01/12/19 86.2 kg    Ideal Body Weight:  72.7 kg  BMI:  Body mass index is 28.06 kg/m.  Estimated Nutritional Needs:   Kcal:  2536-6440  Protein:  121-147g  Fluid:  </=2.2L/day    Lars Masson, RD, LDN  After Hours/Weekend Pager: [phone]

## 2019-01-16 ENCOUNTER — Inpatient Hospital Stay (HOSPITAL_COMMUNITY): Payer: BLUE CROSS/BLUE SHIELD

## 2019-01-16 LAB — GLUCOSE, CAPILLARY
GLUCOSE-CAPILLARY: 121 mg/dL — AB (ref 70–99)
Glucose-Capillary: 124 mg/dL — ABNORMAL HIGH (ref 70–99)
Glucose-Capillary: 110 mg/dL — ABNORMAL HIGH (ref 70–99)
Glucose-Capillary: 106 mg/dL — ABNORMAL HIGH (ref 70–99)
Glucose-Capillary: 110 mg/dL — ABNORMAL HIGH (ref 70–99)
Glucose-Capillary: 101 mg/dL — ABNORMAL HIGH (ref 70–99)

## 2019-01-16 LAB — MAGNESIUM
MAGNESIUM: 1.9 mg/dL (ref 1.7–2.4)
MAGNESIUM: 2.2 mg/dL (ref 1.7–2.4)

## 2019-01-16 LAB — PHOSPHORUS
Phosphorus: 3.1 mg/dL (ref 2.5–4.6)
Phosphorus: 3.4 mg/dL (ref 2.5–4.6)

## 2019-01-16 MED ORDER — SODIUM CHLORIDE 0.9 % IV SOLN
1.0000 g | Freq: Two times a day (BID) | INTRAVENOUS | Status: DC
  Administered 2019-01-16 – 2019-01-18 (×6): 1 g via INTRAVENOUS
  Filled 2019-01-16 (×7): qty 1

## 2019-01-16 MED ORDER — HYDROCODONE-ACETAMINOPHEN 7.5-325 MG/15ML PO SOLN
15.0000 mL | ORAL | Status: DC | PRN
  Administered 2019-01-16 – 2019-01-18 (×2): 15 mL
  Filled 2019-01-16 (×2): qty 15

## 2019-01-16 MED ORDER — LORAZEPAM 2 MG/ML IJ SOLN
0.0000 mg | Freq: Two times a day (BID) | INTRAMUSCULAR | Status: DC
  Administered 2019-01-16: 2 mg via INTRAVENOUS
  Filled 2019-01-16: qty 1
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
## 2019-01-16 NOTE — Progress Notes (Signed)
Pt CIWA changed from every 6 hrs to every 12 hrs today. Family is addiment that rounding physician stated he was going to discontinue the ativan. Will hold AM dose until rounding physician can clarify.
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
## 2019-01-16 NOTE — Progress Notes (Signed)
Patient ID: Robert Norris, male   DOB: [birth year], 48 y.o.   MRN: [id number] Seems stable, no new recs, following

## 2019-01-17 LAB — GLUCOSE, CAPILLARY
GLUCOSE-CAPILLARY: 124 mg/dL — AB (ref 70–99)
Glucose-Capillary: 115 mg/dL — ABNORMAL HIGH (ref 70–99)
Glucose-Capillary: 124 mg/dL — ABNORMAL HIGH (ref 70–99)
Glucose-Capillary: 110 mg/dL — ABNORMAL HIGH (ref 70–99)
Glucose-Capillary: 142 mg/dL — ABNORMAL HIGH (ref 70–99)
Glucose-Capillary: 147 mg/dL — ABNORMAL HIGH (ref 70–99)

## 2019-01-17 LAB — BASIC METABOLIC PANEL
Anion gap: 12 (ref 5–15)
BUN: 11 mg/dL (ref 6–20)
CO2: 25 mmol/L (ref 22–32)
CREATININE: 0.64 mg/dL (ref 0.61–1.24)
Calcium: 9.1 mg/dL (ref 8.9–10.3)
Chloride: 98 mmol/L (ref 98–111)
GFR calc Af Amer: >60 mL/min (ref >60)
GFR calc non Af Amer: >60 mL/min (ref >60)
Glucose, Bld: 143 mg/dL — ABNORMAL HIGH (ref 70–99)
Potassium: 4 mmol/L (ref 3.5–5.1)
Sodium: 135 mmol/L (ref 135–145)

## 2019-01-17 LAB — CBC
HCT: 44.6 % (ref 39.0–52.0)
Hemoglobin: 15.3 g/dL (ref 13.0–17.0)
MCH: 31.4 pg (ref 26.0–34.0)
MCHC: 34.3 g/dL (ref 30.0–36.0)
MCV: 91.4 fL (ref 80.0–100.0)
Platelets: 270 10*3/uL (ref 150–400)
RBC: 4.88 MIL/uL (ref 4.22–5.81)
RDW: 13.4 % (ref 11.5–15.5)
WBC: 12.4 10*3/uL — ABNORMAL HIGH (ref 4.0–10.5)
nRBC: 0 % (ref 0.0–0.2)

## 2019-01-17 MED ORDER — FOLIC ACID 1 MG PO TABS
1.0000 mg | ORAL_TABLET | Freq: Every day | ORAL | Status: DC
  Administered 2019-01-18 – 2019-01-25 (×8): 1 mg
  Filled 2019-01-17 (×8): qty 1

## 2019-01-17 MED ORDER — THIAMINE HCL 100 MG/ML IJ SOLN: 100.0000 mg | Freq: Every day | INTRAMUSCULAR | Status: DC

## 2019-01-17 MED ORDER — ADULT MULTIVITAMIN LIQUID CH
15.0000 mL | Freq: Every day | ORAL | Status: DC
  Administered 2019-01-17 – 2019-01-25 (×9): 15 mL
  Filled 2019-01-17 (×10): qty 15

## 2019-01-17 MED ORDER — DEXMEDETOMIDINE HCL IN NACL 200 MCG/50ML IV SOLN: 0.4000 ug/kg/h | INTRAVENOUS | Status: DC

## 2019-01-17 MED ORDER — VITAMIN B-1 100 MG PO TABS
100.0000 mg | ORAL_TABLET | Freq: Every day | ORAL | Status: DC
  Administered 2019-01-18 – 2019-01-25 (×8): 100 mg
  Filled 2019-01-17 (×8): qty 1
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
## 2019-01-18 LAB — CBC WITH DIFFERENTIAL/PLATELET
Abs Immature Granulocytes: 0.06 10*3/uL (ref 0.00–0.07)
BASOS ABS: 0 10*3/uL (ref 0.0–0.1)
Basophils Relative: 0 %
Eosinophils Absolute: 0.2 10*3/uL (ref 0.0–0.5)
Eosinophils Relative: 2 %
HCT: 43.1 % (ref 39.0–52.0)
Hemoglobin: 14.5 g/dL (ref 13.0–17.0)
Immature Granulocytes: 1 %
Lymphocytes Relative: 9 %
Lymphs Abs: 1.1 10*3/uL (ref 0.7–4.0)
MCH: 30.7 pg (ref 26.0–34.0)
MCHC: 33.6 g/dL (ref 30.0–36.0)
MCV: 91.1 fL (ref 80.0–100.0)
Monocytes Absolute: 0.9 10*3/uL (ref 0.1–1.0)
Monocytes Relative: 8 %
Neutro Abs: 9.9 10*3/uL — ABNORMAL HIGH (ref 1.7–7.7)
Neutrophils Relative %: 80 %
Platelets: 269 10*3/uL (ref 150–400)
RBC: 4.73 MIL/uL (ref 4.22–5.81)
RDW: 13.2 % (ref 11.5–15.5)
WBC: 12.2 10*3/uL — ABNORMAL HIGH (ref 4.0–10.5)
nRBC: 0 % (ref 0.0–0.2)

## 2019-01-18 LAB — GLUCOSE, CAPILLARY
Glucose-Capillary: 155 mg/dL — ABNORMAL HIGH (ref 70–99)
Glucose-Capillary: 155 mg/dL — ABNORMAL HIGH (ref 70–99)
Glucose-Capillary: 127 mg/dL — ABNORMAL HIGH (ref 70–99)
Glucose-Capillary: 147 mg/dL — ABNORMAL HIGH (ref 70–99)
Glucose-Capillary: 124 mg/dL — ABNORMAL HIGH (ref 70–99)
Glucose-Capillary: 131 mg/dL — ABNORMAL HIGH (ref 70–99)

## 2019-01-18 LAB — BASIC METABOLIC PANEL
Anion gap: 11 (ref 5–15)
BUN: 18 mg/dL (ref 6–20)
CO2: 25 mmol/L (ref 22–32)
Calcium: 9 mg/dL (ref 8.9–10.3)
Chloride: 98 mmol/L (ref 98–111)
Creatinine, Ser: 0.76 mg/dL (ref 0.61–1.24)
GFR calc Af Amer: >60 mL/min (ref >60)
GFR calc non Af Amer: >60 mL/min (ref >60)
Glucose, Bld: 168 mg/dL — ABNORMAL HIGH (ref 70–99)
Potassium: 4.2 mmol/L (ref 3.5–5.1)
Sodium: 134 mmol/L — ABNORMAL LOW (ref 135–145)

## 2019-01-18 LAB — URINALYSIS, ROUTINE W REFLEX MICROSCOPIC
Bilirubin Urine: NEGATIVE
Glucose, UA: NEGATIVE mg/dL
Hgb urine dipstick: NEGATIVE
Ketones, ur: NEGATIVE mg/dL
Leukocytes, UA: NEGATIVE
Nitrite: NEGATIVE
Protein, ur: NEGATIVE mg/dL
Specific Gravity, Urine: 1.021 (ref 1.005–1.030)
pH: 8 (ref 5.0–8.0)

## 2019-01-18 MED ORDER — PNEUMOCOCCAL VAC POLYVALENT 25 MCG/0.5ML IJ INJ
0.5000 mL | INJECTION | INTRAMUSCULAR | Status: AC
  Administered 2019-01-19: 0.5 mL via INTRAMUSCULAR
  Filled 2019-01-18: qty 0.5

## 2019-01-18 MED ORDER — LORAZEPAM 2 MG/ML PO CONC: 1.0000 mg | ORAL | Status: DC | PRN

## 2019-01-18 MED ORDER — INFLUENZA VAC SPLIT QUAD 0.5 ML IM SUSY
0.5000 mL | PREFILLED_SYRINGE | INTRAMUSCULAR | Status: AC
  Administered 2019-01-19: 0.5 mL via INTRAMUSCULAR
  Filled 2019-01-18: qty 0.5
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
## 2019-01-19 ENCOUNTER — Inpatient Hospital Stay (HOSPITAL_COMMUNITY): Payer: BLUE CROSS/BLUE SHIELD

## 2019-01-19 LAB — GLUCOSE, CAPILLARY
GLUCOSE-CAPILLARY: 180 mg/dL — AB (ref 70–99)
GLUCOSE-CAPILLARY: 153 mg/dL — AB (ref 70–99)
Glucose-Capillary: 142 mg/dL — ABNORMAL HIGH (ref 70–99)
Glucose-Capillary: 158 mg/dL — ABNORMAL HIGH (ref 70–99)
Glucose-Capillary: 173 mg/dL — ABNORMAL HIGH (ref 70–99)
Glucose-Capillary: 186 mg/dL — ABNORMAL HIGH (ref 70–99)

## 2019-01-19 MED ORDER — POLYETHYLENE GLYCOL 3350 17 G PO PACK
17.0000 g | PACK | Freq: Every day | ORAL | Status: DC
  Administered 2019-01-19 – 2019-01-25 (×5): 17 g
  Filled 2019-01-19 (×6): qty 1

## 2019-01-19 MED ORDER — SODIUM CHLORIDE 0.9 % IV SOLN
1.0000 g | Freq: Three times a day (TID) | INTRAVENOUS | Status: AC
  Administered 2019-01-19 – 2019-01-22 (×12): 1 g via INTRAVENOUS
  Filled 2019-01-19 (×13): qty 1

## 2019-01-19 MED ORDER — ENOXAPARIN SODIUM 40 MG/0.4ML ~~LOC~~ SOLN
40.0000 mg | Freq: Every day | SUBCUTANEOUS | Status: DC
  Administered 2019-01-19 – 2019-02-18 (×29): 40 mg via SUBCUTANEOUS
  Filled 2019-01-19 (×32): qty 0.4

## 2019-01-19 MED ORDER — FREE WATER
200.0000 mL | Freq: Three times a day (TID) | Status: DC
  Administered 2019-01-19 – 2019-01-25 (×17): 200 mL

## 2019-01-19 MED ORDER — DOCUSATE SODIUM 50 MG/5ML PO LIQD
100.0000 mg | Freq: Two times a day (BID) | ORAL | Status: DC
  Administered 2019-01-19 – 2019-01-25 (×9): 100 mg
  Filled 2019-01-19 (×11): qty 10

## 2019-01-19 MED ORDER — LORAZEPAM 2 MG/ML IJ SOLN: 1.0000 mg | INTRAMUSCULAR | Status: DC | PRN

## 2019-01-19 MED ORDER — LORAZEPAM 2 MG/ML IJ SOLN
0.0000 mg | Freq: Two times a day (BID) | INTRAMUSCULAR | Status: AC
  Administered 2019-01-21: 2 mg via INTRAVENOUS
  Filled 2019-01-19: qty 1

## 2019-01-19 MED ORDER — METOPROLOL TARTRATE 5 MG/5ML IV SOLN
5.0000 mg | Freq: Four times a day (QID) | INTRAVENOUS | Status: DC | PRN
  Administered 2019-01-19 – 2019-01-27 (×2): 5 mg via INTRAVENOUS
  Filled 2019-01-19 (×2): qty 5

## 2019-01-19 NOTE — Progress Notes (Signed)
Subjective: Patient is stable, more alert this morning than last week   Objective: Vital signs in last 24 hours: Temp:  [99.4 F (37.4 C)-101.9 F (38.8 C)] 99.9 F (37.7 C) (01/20 0800) Pulse Rate:  [62-110] 77 (01/20 0700) Resp:  [7-19] 11 (01/20 0700) BP: (110-143)/(74-93) 115/74 (01/20 0700) SpO2:  [96 %-100 %] 100 % (01/20 0700)  Intake/Output from previous day: 01/19 0701 - 01/20 0700 In: 1865 [I.V.:265; NG/GT:1500; IV Piggyback:100] Out: 1275 [Urine:1275] Intake/Output this shift: No intake/output data recorded.   Lab Results: Lab Results  Component Value Date   WBC 12.2 (H) 01/18/2019   HGB 14.5 01/18/2019   HCT 43.1 01/18/2019   MCV 91.1 01/18/2019   PLT 269 01/18/2019   Lab Results  Component Value Date   INR 0.90 01/12/2019   BMET Lab Results  Component Value Date   NA 134 (L) 01/18/2019   K 4.2 01/18/2019   CL 98 01/18/2019   CO2 25 01/18/2019   GLUCOSE 168 (H) 01/18/2019   BUN 18 01/18/2019   CREATININE 0.76 01/18/2019   CALCIUM 9.0 01/18/2019    Studies/Results: No results found.  Assessment/Plan: TBI- seems to be more alert but does not FC. No new nsgy recom.   LOS: 7 days    Tiana Loft Lyra Alaimo 01/19/2019, 8:09 AM
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
## 2019-01-19 NOTE — Progress Notes (Signed)
RN assessed pt noticed that NG tube was measuring longer externally than previously recorded in flowsheet. Pt is tachycardic with a temp of 102.9 axillary. RN notified MD on call who gave orders for Xray verifying tube placement. MD was notified of results and gave order for RN to advance tube 10cm and start back tube feeding. RN carried out order with another RN on floor. Will cont. To monitor.
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
## 2019-01-19 NOTE — Progress Notes (Addendum)
On call paged regarding pt's HR for PRN; since patient's arrival HR maintaining 110s-130 (previously today NSR in 70s-90s). Patient does not appear in distress.  Return page [phone], order obtained for lopressor 5mg  IV q6h PRN for HR>120.
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
## 2019-01-20 ENCOUNTER — Inpatient Hospital Stay (HOSPITAL_COMMUNITY): Payer: BLUE CROSS/BLUE SHIELD

## 2019-01-20 LAB — BASIC METABOLIC PANEL
Anion gap: 10 (ref 5–15)
BUN: 24 mg/dL — AB (ref 6–20)
CO2: 26 mmol/L (ref 22–32)
Calcium: 9.1 mg/dL (ref 8.9–10.3)
Chloride: 98 mmol/L (ref 98–111)
Creatinine, Ser: 0.79 mg/dL (ref 0.61–1.24)
GFR calc Af Amer: >60 mL/min (ref >60)
GFR calc non Af Amer: >60 mL/min (ref >60)
Glucose, Bld: 167 mg/dL — ABNORMAL HIGH (ref 70–99)
Potassium: 4.1 mmol/L (ref 3.5–5.1)
Sodium: 134 mmol/L — ABNORMAL LOW (ref 135–145)

## 2019-01-20 LAB — GLUCOSE, CAPILLARY
Glucose-Capillary: 160 mg/dL — ABNORMAL HIGH (ref 70–99)
Glucose-Capillary: 173 mg/dL — ABNORMAL HIGH (ref 70–99)
Glucose-Capillary: 172 mg/dL — ABNORMAL HIGH (ref 70–99)
Glucose-Capillary: 170 mg/dL — ABNORMAL HIGH (ref 70–99)
Glucose-Capillary: 142 mg/dL — ABNORMAL HIGH (ref 70–99)

## 2019-01-20 LAB — CBC
HCT: 40.8 % (ref 39.0–52.0)
Hemoglobin: 13.7 g/dL (ref 13.0–17.0)
MCH: 30.7 pg (ref 26.0–34.0)
MCHC: 33.6 g/dL (ref 30.0–36.0)
MCV: 91.5 fL (ref 80.0–100.0)
Platelets: 303 10*3/uL (ref 150–400)
RBC: 4.46 MIL/uL (ref 4.22–5.81)
RDW: 13.6 % (ref 11.5–15.5)
WBC: 12.7 10*3/uL — ABNORMAL HIGH (ref 4.0–10.5)
nRBC: 0 % (ref 0.0–0.2)
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
## 2019-01-20 NOTE — Plan of Care (Signed)
Initial goals achieved  new goals established
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
## 2019-01-20 NOTE — Progress Notes (Signed)
RN assessed pt NG tube and noticed it was measuring longer externally than measured on dayshift. RN notified MD who ordered Abdominal Xray to verify placement. MD gave order to advance NG tube and verify placement. Pt NG tube was advanced and placement was verified. Pt is now measuring 31cm externally and is comfortable. Tube feedings were restarted.
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
## 2019-01-21 ENCOUNTER — Encounter (HOSPITAL_COMMUNITY): Payer: Self-pay | Admitting: Physical Medicine and Rehabilitation

## 2019-01-21 DIAGNOSIS — S069X9A Unspecified intracranial injury with loss of consciousness of unspecified duration, initial encounter: Secondary | ICD-10-CM

## 2019-01-21 DIAGNOSIS — R1310 Dysphagia, unspecified: Secondary | ICD-10-CM

## 2019-01-21 LAB — BASIC METABOLIC PANEL
Anion gap: 10 (ref 5–15)
BUN: 21 mg/dL — ABNORMAL HIGH (ref 6–20)
CALCIUM: 9.1 mg/dL (ref 8.9–10.3)
CO2: 25 mmol/L (ref 22–32)
Chloride: 101 mmol/L (ref 98–111)
Creatinine, Ser: 0.73 mg/dL (ref 0.61–1.24)
GFR calc Af Amer: >60 mL/min (ref >60)
Glucose, Bld: 163 mg/dL — ABNORMAL HIGH (ref 70–99)
Potassium: 4.3 mmol/L (ref 3.5–5.1)
Sodium: 136 mmol/L (ref 135–145)

## 2019-01-21 LAB — CULTURE, RESPIRATORY W GRAM STAIN
Culture: NORMAL
Special Requests: NORMAL

## 2019-01-21 LAB — CBC
HCT: 38.5 % — ABNORMAL LOW (ref 39.0–52.0)
Hemoglobin: 13.1 g/dL (ref 13.0–17.0)
MCH: 31.8 pg (ref 26.0–34.0)
MCHC: 34 g/dL (ref 30.0–36.0)
MCV: 93.4 fL (ref 80.0–100.0)
NRBC: 0 % (ref 0.0–0.2)
Platelets: 324 10*3/uL (ref 150–400)
RBC: 4.12 MIL/uL — ABNORMAL LOW (ref 4.22–5.81)
RDW: 13.8 % (ref 11.5–15.5)
WBC: 10.4 10*3/uL (ref 4.0–10.5)

## 2019-01-21 LAB — GLUCOSE, CAPILLARY
Glucose-Capillary: 132 mg/dL — ABNORMAL HIGH (ref 70–99)
Glucose-Capillary: 133 mg/dL — ABNORMAL HIGH (ref 70–99)
Glucose-Capillary: 148 mg/dL — ABNORMAL HIGH (ref 70–99)
Glucose-Capillary: 168 mg/dL — ABNORMAL HIGH (ref 70–99)
Glucose-Capillary: 178 mg/dL — ABNORMAL HIGH (ref 70–99)
Glucose-Capillary: 182 mg/dL — ABNORMAL HIGH (ref 70–99)

## 2019-01-21 LAB — CULTURE, RESPIRATORY

## 2019-01-21 NOTE — Progress Notes (Signed)
Inpatient Rehabilitation-Admissions Coordinator   [facility] met with pt at the bedside. AC introduced self, left brochures and card, and plans to follow up with family tomorrow regarding CIR program and possible interest.  Please call if questions.  Jhonnie Garner, OTR/L  Rehab Admissions Coordinator  [phone] 01/21/2019 5:11 PM
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
## 2019-01-21 NOTE — Progress Notes (Signed)
[facility]/Trauma Progress Note      [facility]fal[facility] SDH/ICC- per Dr. Yetta BarreJones, TBI team therapies Nasal FX and large facial abrasions- closed reduction planned per Dr. Ulice Boldillingham, bacitracin ID- resp culture 1/20 showed normal flora , afebrile, WBC 10k, Maxipime empiric until 01/23 ETOH abuse disorder- ETOH 280 on admit, CIWA renewed Cocaine abuse- CSW eval FEN- TF via Cortrak for now. Plan PEG later this week. VTE- PAS; Lovenox Dispo- PT/OT/ST. CIR consult pending   LOS: 9 days    Subjective: CC: MCC  Sleeping. NAD. Opened eyes but then went back to sleep.  Objective: Vital signs in last 24 hours: Temp:  [97.9 F (36.6 C)-99.9 F (37.7 C)] 99.9 F (37.7 C) (01/22 0400) Pulse Rate:  [68-96] 68 (01/22 0400) Resp:  [15-17] 15 (01/22 0400) BP: (128-142)/(78-85) 128/78 (01/21 1600) SpO2:  [96 %-100 %] 96 % (01/22 0400) Weight:  [78.1 kg] 78.1 kg (01/22 0500) Last BM Date: 01/20/19  Intake/Output from previous day: 01/21 0701 - 01/22 0700 In: 2730.9 [I.V.:161.9; NG/GT:2269; IV Piggyback:300] Out: 1900 [Urine:1900] Intake/Output this shift: No intake/output data recorded.  PE: Gen:  Sleeping, NAD HEENT: multiple abrasions, NGT in place Card:  RRR, no M/G/R heard, 2 + PT pulses bilaterally Pulm:  CTA, no W/R/R, rate and effort normal Abd: Soft, NT/ND, +BS Skin: no rashes noted, warm and dry   Anti-infectives: Anti-infectives (From admission, onward)   Start     Dose/Rate Route Frequency Ordered Stop   01/19/19 0800  ceFEPIme (MAXIPIME) 1 g in sodium chloride 0.9 % 100 mL IVPB     1 g 200 mL/hr over 30 Minutes Intravenous Every 8 hours 01/19/19 0733     01/16/19 1000  ceFEPIme (MAXIPIME) 1 g in sodium chloride 0.9 % 100 mL IVPB  Status:  Discontinued     1 g 200 mL/hr over 30 Minutes Intravenous Every 12 hours 01/16/19 0929 01/19/19 0733      Lab Results:  Recent Labs    01/20/19 0710 01/21/19 0400  WBC 12.7* 10.4   HGB 13.7 13.1  HCT 40.8 38.5*  PLT 303 324   BMET Recent Labs    01/20/19 0710 01/21/19 0400  NA 134* 136  K 4.1 4.3  CL 98 101  CO2 26 25  GLUCOSE 167* 163*  BUN 24* 21*  CREATININE 0.79 0.73  CALCIUM 9.1 9.1   PT/INR No results for input(s): LABPROT, INR in the last 72 hours. CMP     Component Value Date/Time   NA 136 01/21/2019 0400   K 4.3 01/21/2019 0400   CL 101 01/21/2019 0400   CO2 25 01/21/2019 0400   GLUCOSE 163 (H) 01/21/2019 0400   BUN 21 (H) 01/21/2019 0400   CREATININE 0.73 01/21/2019 0400   CALCIUM 9.1 01/21/2019 0400   PROT 7.1 01/12/2019 0048   ALBUMIN 4.1 01/12/2019 0048   AST 23 01/12/2019 0048   ALT 20 01/12/2019 0048   ALKPHOS 51 01/12/2019 0048   BILITOT 0.6 01/12/2019 0048   GFRNONAA >60 01/21/2019 0400   GFRAA >60 01/21/2019 0400   Lipase  No results found for: LIPASE  Studies/Results: Dg Abd 1 View  Result Date: 01/19/2019 CLINICAL DATA:  Nasogastric tube placement EXAM: ABDOMEN - 1 VIEW COMPARISON:  01/15/2019 FINDINGS: A gastric tube is noted with tip just barely beyond the GE junction and side-port noted above the level of the GE junction by at least 3.4 cm. Further advancement by at least 7-10 cm is suggested. Moderate gaseous distention  of the stomach. Moderate stool retention within the colon. No free air is identified. No acute nor suspicious osseous abnormality. IMPRESSION: Further advancement of the gastric tube by at least 7-10 cm is suggested as the side-port is noted above the GE junction by approximately 3.4 cm. Electronically Signed   By: Tollie Ethavid  Kwon M.D.   On: 01/19/2019 21:42   Dg Chest Port 1 View  Result Date: 01/20/2019 CLINICAL DATA:  Acute respiratory failure.  Trauma with fever. EXAM: PORTABLE CHEST 1 VIEW COMPARISON:  Four days ago FINDINGS: Nasogastric tube tip remains in good position at the level of the proximal stomach. Mildly low lung volumes with interstitial crowding. There is no edema, consolidation, effusion,  or pneumothorax. Normal heart size and mediastinal contours. IMPRESSION: Stable exam with no convincing pneumonia. Electronically Signed   By: Marnee SpringJonathon  Watts M.D.   On: 01/20/2019 07:13   Dg Abd Portable 1v  Result Date: 01/20/2019 CLINICAL DATA:  NG tube placement EXAM: PORTABLE ABDOMEN - 1 VIEW COMPARISON:  01/20/2019 FINDINGS: Esophageal tube tip overlies the proximal stomach. Side-port likely over the distal esophagus. Nonobstructed gas pattern IMPRESSION: Esophageal tube tip overlies the proximal stomach, side-port likely over the distal esophagus. Suggest further advancement for more optimal positioning Electronically Signed   By: Jasmine PangKim  Fujinaga M.D.   On: 01/20/2019 22:14   Dg Abd Portable 1v  Result Date: 01/20/2019 CLINICAL DATA:  NG tube placement EXAM: PORTABLE ABDOMEN - 1 VIEW COMPARISON:  01/19/2019 FINDINGS: Esophageal tube tip at the distal mediastinum. Nonobstructed gas pattern. Possible soft tissue foreign body left lower soft tissues. Amorphous calcifications in the right lower quadrant, no change. IMPRESSION: Esophageal tube tip overlies the distal mediastinum Electronically Signed   By: Jasmine PangKim  Fujinaga M.D.   On: 01/20/2019 22:13      Jerre SimonJessica L Mikeal Winstanley , [facility] 01/21/2019, 8:06 AM  Pager: [phone] Mon-Wed, Friday 7:00am-4:30pm Thurs 7am-11:30am  Consults: [phone]

## 2019-01-22 ENCOUNTER — Inpatient Hospital Stay (HOSPITAL_COMMUNITY): Payer: BLUE CROSS/BLUE SHIELD

## 2019-01-22 LAB — GLUCOSE, CAPILLARY
GLUCOSE-CAPILLARY: 185 mg/dL — AB (ref 70–99)
GLUCOSE-CAPILLARY: 173 mg/dL — AB (ref 70–99)
Glucose-Capillary: 147 mg/dL — ABNORMAL HIGH (ref 70–99)
Glucose-Capillary: 147 mg/dL — ABNORMAL HIGH (ref 70–99)
Glucose-Capillary: 151 mg/dL — ABNORMAL HIGH (ref 70–99)
Glucose-Capillary: 165 mg/dL — ABNORMAL HIGH (ref 70–99)

## 2019-01-22 MED ORDER — LORAZEPAM 2 MG/ML IJ SOLN
0.5000 mg | Freq: Four times a day (QID) | INTRAMUSCULAR | Status: DC | PRN
  Administered 2019-01-26 – 2019-02-03 (×13): 0.5 mg via INTRAVENOUS
  Filled 2019-01-22 (×15): qty 1

## 2019-01-22 MED ORDER — SODIUM CHLORIDE 0.9% FLUSH: 10.0000 mL | INTRAVENOUS | Status: DC | PRN

## 2019-01-22 NOTE — Progress Notes (Addendum)
Inpatient Rehabilitation-Admissions Coordinator   [facility] plan. Spoke with pt's mother regarding this information and shared difference between in-network and out-of-network cost for CIR. Pt's mother wants to pursue an in-network option due to cost. [facility] will [facility] up with CM regarding conversation with his mother and requests.   AC will sign off.   Please call if questions.   Nanine MeansKelly Antoine Fiallos, OTR/L  Rehab Admissions Coordinator  [phone] 01/22/2019 2:39 PM

## 2019-01-22 NOTE — Progress Notes (Signed)
Patient ID: Robert Norris, male   DOB: February 08, 1971, 48 y.o.   MRN: [id number] Patient arouses to stimuli.  No real verbalization.  Not following commands.  Localizes.  Seems to move his extremities.  Follow-up CT

## 2019-01-22 NOTE — Progress Notes (Signed)
Inpatient Rehabilitation-Admissions Coordinator   [facility] spoke with pt's mother via phone to discuss CIR program. Pt's mother interested in the program but understands we will need to await tolerance and planned nasal surgery before admitting. [facility] encouraged family discussion regarding support at DC. AC plans to follow up with family tomorrow.   Please call if questions.   Nanine Means, OTR/L  Rehab Admissions Coordinator  [phone] 01/22/2019 11:33 AM
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
## 2019-01-22 NOTE — Plan of Care (Signed)
  Problem: Clinical Measurements: Goal: Ability to maintain clinical measurements within normal limits will improve Outcome: Progressing Goal: Diagnostic test results will improve Outcome: Progressing Goal: Respiratory complications will improve Outcome: Progressing Goal: Cardiovascular complication will be avoided Outcome: Progressing   

## 2019-01-22 NOTE — Progress Notes (Signed)
SLP Cancellation Note  Patient Details Name: Robert Norris MRN: [id number] DOB: May 12, 1971   Cancelled treatment:       Reason Eval/Treat Not Completed: Fatigue/lethargy limiting ability to participate. Pt received ativan overnight and is not sufficiently alert for MBS today. Will attempt on next date as able.   Virl Axe Farrell Broerman 01/22/2019, 9:28 AM  Maxcine Ham Ava Deguire, M.A. CCC-SLP Acute Herbalist [phone] Office [phone]

## 2019-01-23 ENCOUNTER — Inpatient Hospital Stay (HOSPITAL_COMMUNITY): Payer: BLUE CROSS/BLUE SHIELD

## 2019-01-23 LAB — GLUCOSE, CAPILLARY
GLUCOSE-CAPILLARY: 147 mg/dL — AB (ref 70–99)
Glucose-Capillary: 142 mg/dL — ABNORMAL HIGH (ref 70–99)
Glucose-Capillary: 153 mg/dL — ABNORMAL HIGH (ref 70–99)
Glucose-Capillary: 154 mg/dL — ABNORMAL HIGH (ref 70–99)
Glucose-Capillary: 159 mg/dL — ABNORMAL HIGH (ref 70–99)
Glucose-Capillary: 164 mg/dL — ABNORMAL HIGH (ref 70–99)
Glucose-Capillary: 176 mg/dL — ABNORMAL HIGH (ref 70–99)

## 2019-01-23 NOTE — Progress Notes (Signed)
Modified Barium Swallow Progress Note  Patient Details  Name: Robert Norris MRN: [id number] Date of Birth: 12-07-1971  Today's Date: 01/23/2019  Modified Barium Swallow completed.  Full report located under Chart Review in the Imaging Section.  Brief recommendations include the following:  Clinical Impression  Pt has a mild oropharyngeal dysphagia but without any aspiration observed. He has an anterior munching pattern with solids, which is mildly prolonged. He has mild lingual/base of tongue residue across consistencies that he typically uses a spontaneous second swallow to clear, although there is a brief delay before he does so. Trace, intermittent penetration occurs with thin liquids, in part because of impaired timing but there also appears to be some impact of NGT on full epiglottic inversion. The majority of penetrates clear upon completion of the swallow or subsequent swallow, with only one instance in which it remained post-swallow, at which time pt had a spontaneous throat clear to eject it. Recommend initiating Dys 1 diet and thin liquids with good potential for advancement with improved mentation. Would provide full supervision for intake at this time.    Swallow Evaluation Recommendations       SLP Diet Recommendations: Dysphagia 1 (Puree) solids;Thin liquid   Liquid Administration via: Cup;Straw   Medication Administration: Whole meds with puree(may need to crush if pt is chewing them)   Supervision: Staff to assist with self feeding;Full supervision/cueing for compensatory strategies   Compensations: Minimize environmental distractions;Slow rate;Small sips/bites   Postural Changes: Seated upright at 90 degrees   Oral Care Recommendations: Oral care BID        Virl Axe Saydi Kobel 01/23/2019,1:47 PM   Natalia Leatherwood, M.A. CCC-SLP Acute Herbalist [phone] Office [phone]
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
## 2019-01-23 NOTE — Progress Notes (Signed)
Met with pt's mother to discuss discharge planning.  Mother interested in inpatient rehab for patient; unfortunately, pt's insurance is contracted with only [employer] facilities for inpatient rehab.  Mom is agreeable to referrals to both [facility] and [facility], but prefers [place] due to the driving distance.  Faxed referrals to both rehab centers.   Will prepare letter for pt's mother stating that pt does not have decision making capability at this time, per her request.   Will follow up with updates on IP rehab as they are available.  Likely nasal surgery to be done prior to dc to rehab.    Reinaldo Raddle, RN, BSN  Trauma/Neuro ICU Case Manager [phone]

## 2019-01-23 NOTE — Progress Notes (Signed)
Patient ID: Robert Norris, male   DOB: [birth year], 48 y.o.   MRN: [id number] CT head looks good. We will sign off, please call if we can help further

## 2019-01-24 LAB — GLUCOSE, CAPILLARY
GLUCOSE-CAPILLARY: 178 mg/dL — AB (ref 70–99)
Glucose-Capillary: 113 mg/dL — ABNORMAL HIGH (ref 70–99)
Glucose-Capillary: 166 mg/dL — ABNORMAL HIGH (ref 70–99)
Glucose-Capillary: 202 mg/dL — ABNORMAL HIGH (ref 70–99)

## 2019-01-24 NOTE — Plan of Care (Signed)
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
## 2019-01-24 NOTE — Progress Notes (Signed)
RN noted NG tube to be out of nare more than expected. Page to MD on call ([place] @ [phone]) MD ordered for feedings to be held for the remainder of the night. Patient with respirations even and unlabored.

## 2019-01-25 ENCOUNTER — Inpatient Hospital Stay (HOSPITAL_COMMUNITY): Payer: BLUE CROSS/BLUE SHIELD

## 2019-01-25 LAB — GLUCOSE, CAPILLARY
GLUCOSE-CAPILLARY: 117 mg/dL — AB (ref 70–99)
GLUCOSE-CAPILLARY: 170 mg/dL — AB (ref 70–99)
Glucose-Capillary: 139 mg/dL — ABNORMAL HIGH (ref 70–99)
Glucose-Capillary: 168 mg/dL — ABNORMAL HIGH (ref 70–99)
Glucose-Capillary: 134 mg/dL — ABNORMAL HIGH (ref 70–99)
Glucose-Capillary: 150 mg/dL — ABNORMAL HIGH (ref 70–99)
Glucose-Capillary: 135 mg/dL — ABNORMAL HIGH (ref 70–99)
Glucose-Capillary: 131 mg/dL — ABNORMAL HIGH (ref 70–99)

## 2019-01-25 MED ORDER — RESOURCE THICKENUP CLEAR PO POWD
ORAL | Status: DC | PRN
  Filled 2019-01-25 (×2): qty 125

## 2019-01-25 NOTE — Progress Notes (Signed)
2055: On-call provider paged for x-ray stating NG tube not seen. Advised to not replace NG tube and continue w/ new Dys I diet. Will continue to monitor.
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
## 2019-01-25 NOTE — Progress Notes (Signed)
Paged Dr Foster Simpson, plastic surgeon, for pre-op signed and held orders  clarifications as for the scheduled procedure date . Confirmed date for January 28, 2019;  that other orders to include NPO past midnight, Antibiotic to send to OR on call and preps  be adjusted accordingly. Vaani Morren Ruthe Mannan, BSN, RN

## 2019-01-25 NOTE — Progress Notes (Signed)
NGT noted displaced from previously marking, making it looking longer. Family at the bedside made aware of TF pull. Feeding held at 1700. Patient in no acute respiratory distress. Portable CXR ordered and completed. Oral  Intake encouraged. Sister fed and patient able to consume about 30% dinner. Kenidee Cregan Ruthe Mannan, BSN, RN

## 2019-01-26 LAB — GLUCOSE, CAPILLARY
Glucose-Capillary: 137 mg/dL — ABNORMAL HIGH (ref 70–99)
Glucose-Capillary: 122 mg/dL — ABNORMAL HIGH (ref 70–99)

## 2019-01-26 LAB — CBC
HCT: 43 % (ref 39.0–52.0)
Hemoglobin: 14.6 g/dL (ref 13.0–17.0)
MCH: 31.5 pg (ref 26.0–34.0)
MCHC: 34 g/dL (ref 30.0–36.0)
MCV: 92.9 fL (ref 80.0–100.0)
NRBC: 0 % (ref 0.0–0.2)
Platelets: 524 10*3/uL — ABNORMAL HIGH (ref 150–400)
RBC: 4.63 MIL/uL (ref 4.22–5.81)
RDW: 13.4 % (ref 11.5–15.5)
WBC: 16 10*3/uL — ABNORMAL HIGH (ref 4.0–10.5)

## 2019-01-26 LAB — BASIC METABOLIC PANEL
Anion gap: 12 (ref 5–15)
BUN: 25 mg/dL — ABNORMAL HIGH (ref 6–20)
CO2: 24 mmol/L (ref 22–32)
Calcium: 9.9 mg/dL (ref 8.9–10.3)
Chloride: 100 mmol/L (ref 98–111)
Creatinine, Ser: 0.87 mg/dL (ref 0.61–1.24)
GFR calc Af Amer: >60 mL/min (ref >60)
GFR calc non Af Amer: >60 mL/min (ref >60)
Glucose, Bld: 144 mg/dL — ABNORMAL HIGH (ref 70–99)
Potassium: 4.1 mmol/L (ref 3.5–5.1)
Sodium: 136 mmol/L (ref 135–145)

## 2019-01-26 MED ORDER — HYDROCODONE-ACETAMINOPHEN 5-325 MG PO TABS
1.0000 | ORAL_TABLET | Freq: Four times a day (QID) | ORAL | Status: DC | PRN
  Administered 2019-01-31 – 2019-02-10 (×10): 2 via ORAL
  Administered 2019-02-10: 1 via ORAL
  Administered 2019-02-11 – 2019-02-13 (×6): 2 via ORAL
  Administered 2019-02-13: 1 via ORAL
  Administered 2019-02-14 – 2019-02-18 (×10): 2 via ORAL
  Filled 2019-01-26 (×14): qty 2
  Filled 2019-01-26: qty 1
  Filled 2019-01-26 (×13): qty 2

## 2019-01-26 MED ORDER — VITAMIN B-1 100 MG PO TABS
100.0000 mg | ORAL_TABLET | Freq: Every day | ORAL | Status: DC
  Administered 2019-01-26 – 2019-02-18 (×23): 100 mg via ORAL
  Filled 2019-01-26 (×24): qty 1

## 2019-01-26 MED ORDER — ADULT MULTIVITAMIN LIQUID CH
15.0000 mL | Freq: Every day | ORAL | Status: DC
  Administered 2019-01-26 – 2019-01-27 (×2): 15 mL via ORAL
  Filled 2019-01-26 (×3): qty 15

## 2019-01-26 MED ORDER — ACETAMINOPHEN 325 MG PO TABS
650.0000 mg | ORAL_TABLET | Freq: Four times a day (QID) | ORAL | Status: DC | PRN
  Administered 2019-01-30 – 2019-02-14 (×5): 650 mg via ORAL
  Filled 2019-01-26 (×6): qty 2

## 2019-01-26 MED ORDER — HYDROCODONE-ACETAMINOPHEN 5-325 MG PO TABS: 1.0000 | ORAL_TABLET | ORAL | Status: DC | PRN

## 2019-01-26 MED ORDER — POLYETHYLENE GLYCOL 3350 17 G PO PACK
17.0000 g | PACK | Freq: Every day | ORAL | Status: DC
  Administered 2019-01-26 – 2019-02-18 (×16): 17 g via ORAL
  Filled 2019-01-26 (×20): qty 1

## 2019-01-26 MED ORDER — BOOST / RESOURCE BREEZE PO LIQD CUSTOM
1.0000 | Freq: Three times a day (TID) | ORAL | Status: DC
  Administered 2019-01-26 – 2019-02-18 (×62): 1 via ORAL

## 2019-01-26 MED ORDER — FOLIC ACID 1 MG PO TABS
1.0000 mg | ORAL_TABLET | Freq: Every day | ORAL | Status: DC
  Administered 2019-01-26 – 2019-02-18 (×23): 1 mg via ORAL
  Filled 2019-01-26 (×24): qty 1

## 2019-01-26 MED ORDER — AZITHROMYCIN 500 MG PO TABS
500.0000 mg | ORAL_TABLET | Freq: Every day | ORAL | Status: AC
  Administered 2019-01-26: 500 mg via ORAL
  Filled 2019-01-26: qty 1

## 2019-01-26 MED ORDER — DOCUSATE SODIUM 100 MG PO CAPS
100.0000 mg | ORAL_CAPSULE | Freq: Two times a day (BID) | ORAL | Status: DC
  Administered 2019-01-26 – 2019-02-18 (×41): 100 mg via ORAL
  Filled 2019-01-26 (×43): qty 1

## 2019-01-26 MED ORDER — AZITHROMYCIN 250 MG PO TABS
250.0000 mg | ORAL_TABLET | Freq: Every day | ORAL | Status: AC
  Administered 2019-01-27 – 2019-01-30 (×4): 250 mg via ORAL
  Filled 2019-01-26 (×4): qty 1
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
## 2019-01-27 LAB — CBC
HCT: 42.5 % (ref 39.0–52.0)
HEMOGLOBIN: 14 g/dL (ref 13.0–17.0)
MCH: 30.6 pg (ref 26.0–34.0)
MCHC: 32.9 g/dL (ref 30.0–36.0)
MCV: 93 fL (ref 80.0–100.0)
Platelets: 567 10*3/uL — ABNORMAL HIGH (ref 150–400)
RBC: 4.57 MIL/uL (ref 4.22–5.81)
RDW: 13.5 % (ref 11.5–15.5)
WBC: 8.7 10*3/uL (ref 4.0–10.5)
nRBC: 0 % (ref 0.0–0.2)

## 2019-01-27 MED ORDER — CHLORHEXIDINE GLUCONATE 4 % EX LIQD: 1.0000 "application " | Freq: Once | CUTANEOUS | Status: DC

## 2019-01-27 NOTE — Plan of Care (Signed)
  Problem: Education: Goal: Knowledge of General Education information will improve Description Including pain rating scale, medication(s)/side effects and non-pharmacologic comfort measures Outcome: Progressing   Problem: Clinical Measurements: Goal: Ability to maintain clinical measurements within normal limits will improve Outcome: Progressing Goal: Will remain free from infection Outcome: Progressing Goal: Diagnostic test results will improve Outcome: Progressing Goal: Respiratory complications will improve Outcome: Progressing Goal: Cardiovascular complication will be avoided Outcome: Progressing   Problem: Activity: Goal: Risk for activity intolerance will decrease Outcome: Progressing   Problem: Elimination: Goal: Will not experience complications related to bowel motility Outcome: Progressing Goal: Will not experience complications related to urinary retention Outcome: Progressing   Problem: Pain Managment: Goal: General experience of comfort will improve Outcome: Progressing   
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
## 2019-01-28 ENCOUNTER — Encounter (HOSPITAL_COMMUNITY): Admission: EM | Disposition: A | Discharge status: Home Health | Payer: Self-pay | Source: Home / Self Care

## 2019-01-28 ENCOUNTER — Inpatient Hospital Stay (HOSPITAL_COMMUNITY): Payer: BLUE CROSS/BLUE SHIELD | Admitting: Certified Registered Nurse Anesthetist

## 2019-01-28 ENCOUNTER — Encounter (HOSPITAL_COMMUNITY): Payer: Self-pay | Admitting: *Deleted

## 2019-01-28 DIAGNOSIS — S022XXA Fracture of nasal bones, initial encounter for closed fracture: Secondary | ICD-10-CM

## 2019-01-28 HISTORY — PX: CLOSED REDUCTION NASAL FRACTURE: SHX5365

## 2019-01-28 LAB — GLUCOSE, CAPILLARY
Glucose-Capillary: 136 mg/dL — ABNORMAL HIGH (ref 70–99)
Glucose-Capillary: 91 mg/dL (ref 70–99)

## 2019-01-28 SURGERY — CLOSED REDUCTION, FRACTURE, NASAL BONE
Anesthesia: General | Site: Nose

## 2019-01-28 MED ORDER — CEFAZOLIN SODIUM-DEXTROSE 2-4 GM/100ML-% IV SOLN
INTRAVENOUS | Status: AC
  Filled 2019-01-28: qty 100

## 2019-01-28 MED ORDER — MORPHINE SULFATE (PF) 2 MG/ML IV SOLN
2.0000 mg | INTRAVENOUS | Status: DC | PRN
  Administered 2019-01-28 – 2019-02-02 (×2): 2 mg via INTRAVENOUS
  Filled 2019-01-28 (×2): qty 1

## 2019-01-28 MED ORDER — CEFAZOLIN SODIUM-DEXTROSE 2-4 GM/100ML-% IV SOLN
2.0000 g | INTRAVENOUS | Status: AC
  Administered 2019-01-28: 2 g via INTRAVENOUS

## 2019-01-28 MED ORDER — LABETALOL HCL 5 MG/ML IV SOLN
5.0000 mg | Freq: Once | INTRAVENOUS | Status: AC
  Administered 2019-01-28: 5 mg via INTRAVENOUS

## 2019-01-28 MED ORDER — LIDOCAINE 2% (20 MG/ML) 5 ML SYRINGE
INTRAMUSCULAR | Status: DC | PRN
  Administered 2019-01-28: 100 mg via INTRAVENOUS

## 2019-01-28 MED ORDER — TAB-A-VITE/IRON PO TABS
1.0000 | ORAL_TABLET | Freq: Every day | ORAL | Status: DC
  Administered 2019-01-28 – 2019-02-18 (×20): 1 via ORAL
  Filled 2019-01-28 (×22): qty 1

## 2019-01-28 MED ORDER — LIDOCAINE-EPINEPHRINE 1 %-1:100000 IJ SOLN
INTRAMUSCULAR | Status: AC
  Filled 2019-01-28: qty 1

## 2019-01-28 MED ORDER — MIDAZOLAM HCL 2 MG/2ML IJ SOLN
INTRAMUSCULAR | Status: DC | PRN
  Administered 2019-01-28: 2 mg via INTRAVENOUS

## 2019-01-28 MED ORDER — MIDAZOLAM HCL 2 MG/2ML IJ SOLN
INTRAMUSCULAR | Status: AC
  Filled 2019-01-28: qty 2

## 2019-01-28 MED ORDER — FENTANYL CITRATE (PF) 100 MCG/2ML IJ SOLN
INTRAMUSCULAR | Status: DC | PRN
  Administered 2019-01-28 (×2): 100 ug via INTRAVENOUS

## 2019-01-28 MED ORDER — OXYMETAZOLINE HCL 0.05 % NA SOLN
NASAL | Status: DC | PRN
  Administered 2019-01-28: 1

## 2019-01-28 MED ORDER — FENTANYL CITRATE (PF) 100 MCG/2ML IJ SOLN: 25.0000 ug | INTRAMUSCULAR | Status: DC | PRN

## 2019-01-28 MED ORDER — MEPERIDINE HCL 50 MG/ML IJ SOLN: 6.2500 mg | INTRAMUSCULAR | Status: DC | PRN

## 2019-01-28 MED ORDER — ROCURONIUM BROMIDE 10 MG/ML (PF) SYRINGE
PREFILLED_SYRINGE | INTRAVENOUS | Status: DC | PRN
  Administered 2019-01-28: 50 mg via INTRAVENOUS

## 2019-01-28 MED ORDER — FENTANYL CITRATE (PF) 250 MCG/5ML IJ SOLN
INTRAMUSCULAR | Status: AC
  Filled 2019-01-28: qty 5

## 2019-01-28 MED ORDER — METOCLOPRAMIDE HCL 5 MG/ML IJ SOLN: 10.0000 mg | Freq: Once | INTRAMUSCULAR | Status: DC | PRN

## 2019-01-28 MED ORDER — PROPOFOL 10 MG/ML IV BOLUS
INTRAVENOUS | Status: AC
  Filled 2019-01-28: qty 20

## 2019-01-28 MED ORDER — DEXAMETHASONE SODIUM PHOSPHATE 10 MG/ML IJ SOLN
INTRAMUSCULAR | Status: DC | PRN
  Administered 2019-01-28: 10 mg via INTRAVENOUS

## 2019-01-28 MED ORDER — PROPOFOL 10 MG/ML IV BOLUS
INTRAVENOUS | Status: DC | PRN
  Administered 2019-01-28: 160 mg via INTRAVENOUS

## 2019-01-28 MED ORDER — LACTATED RINGERS IV SOLN: INTRAVENOUS | Status: DC

## 2019-01-28 MED ORDER — LACTATED RINGERS IV SOLN
INTRAVENOUS | Status: DC
  Administered 2019-01-28 – 2019-01-29 (×4): via INTRAVENOUS

## 2019-01-28 MED ORDER — SUGAMMADEX SODIUM 200 MG/2ML IV SOLN
INTRAVENOUS | Status: DC | PRN
  Administered 2019-01-28: 300 mg via INTRAVENOUS

## 2019-01-28 SURGICAL SUPPLY — 24 items
BLADE 10 SAFETY STRL DISP (BLADE) ×2 IMPLANT
CANISTER SUCT 3000ML PPV (MISCELLANEOUS) ×2 IMPLANT
COVER BACK TABLE 60X90IN (DRAPES) ×2 IMPLANT
COVER MAYO STAND STRL (DRAPES) ×2 IMPLANT
COVER WAND RF STERILE (DRAPES) ×2 IMPLANT
GAUZE 4X4 16PLY RFD (DISPOSABLE) ×2 IMPLANT
GAUZE SPONGE 2X2 8PLY STRL LF (GAUZE/BANDAGES/DRESSINGS) ×1 IMPLANT
GLOVE BIO SURGEON STRL SZ 6.5 (GLOVE) ×2 IMPLANT
GOWN STRL REUS W/ TWL LRG LVL3 (GOWN DISPOSABLE) ×2 IMPLANT
GOWN STRL REUS W/TWL LRG LVL3 (GOWN DISPOSABLE) ×4
KIT BASIN OR (CUSTOM PROCEDURE TRAY) ×2 IMPLANT
KIT SPLINT NASAL DENVER SM BEI (GAUZE/BANDAGES/DRESSINGS) ×1 IMPLANT
KIT TURNOVER KIT B (KITS) ×2 IMPLANT
NDL HYPO 25GX1X1/2 BEV (NEEDLE) ×1 IMPLANT
NEEDLE HYPO 25GX1X1/2 BEV (NEEDLE) ×2 IMPLANT
NS IRRIG 1000ML POUR BTL (IV SOLUTION) ×2 IMPLANT
PAD ARMBOARD 7.5X6 YLW CONV (MISCELLANEOUS) ×4 IMPLANT
PATTIES SURGICAL .5 X3 (DISPOSABLE) ×2 IMPLANT
SPLINT NASAL THERMO PLAST (MISCELLANEOUS) ×2 IMPLANT
SPONGE GAUZE 2X2 STER 10/PKG (GAUZE/BANDAGES/DRESSINGS) ×1
SYR CONTROL 10ML LL (SYRINGE) ×2 IMPLANT
TOWEL OR 17X24 6PK STRL BLUE (TOWEL DISPOSABLE) ×2 IMPLANT
TUBE CONNECTING 12X1/4 (SUCTIONS) ×2 IMPLANT
WATER STERILE IRR 1000ML POUR (IV SOLUTION) ×2 IMPLANT

## 2019-01-28 NOTE — Progress Notes (Signed)
01/28/2019 [facility] RECOVERY Rancho Levels I-VI of Cognitive Functioning-Daily Tracking Sheet         Date of Onset ___1/13/20______  Level of function Behavioral Characteristics Initial Eval.  Date: 01/13/19 Date and initial when observed   Level I No response Total Assistance Complete absence of observable change in behavior when presented visual, auditory, tactile, proprioceptive, vestibular or painful stimuli.             Level II Generalized response  Total Assistance Demonstrates generalized reflex response to painful stimuli       Responds to repeated auditory stimuli with increased or decreased activity       Responds to external stimuli with physiological changes generalized, gross body movement and / or not purposeful vocalization               Responses noted above may be same regardless of type and location of stimuli        Responses may be significantly delayed      Level III Localized response  Total Assistance Demonstrates withdrawal or vocalization to painful stimuli       Turns toward or away from auditory stimuli        Blinks when strong light crosses visual field        Follows moving object passed within visual field        Responds to discomfort by pulling tubes or restraints       Responds inconsistently to simple commands       Responses directly related to type of stimulus       May respond to some persons (especially friends and family) but not to others       Level IV Confused/Agitated  Maximal Assistance       Alert and in heightened state of anxiety        Purposeful attempts to remove restraints or tubes or crawl out of bed        May perform motor activities such as sitting, reaching and walking without any apparent purpose or upon another's request        Brief and usually non purposeful moments of focused and sustained attention       Post traumatic amnesia state        Absent goal directed, problem solving,  self monitoring behavior       May cry or scream out of proportion to stimulus even after it's removal       May exhibit aggressive or flight behavior        Mood swing from euphoric to hostile with no apparent relationship to environmental events        Verbalizations are frequently incoherent and/or inappropriate to activity or environment             Level V   Confused/InappropriateNon-Agitated  Maximal Assistance Alert, not agitated but may wander randomly or with vague intention of going home    2/4/20wc    May become agitated in response to external stimulation and/or lack of environmental structure.       Not orientated to person, place and time.   02/03/19 wc    Frequent brief periods, non-purposeful sustained attention.  01/28/19 RBM     Severely impaired recent memory, with confusion of past and present in reaction to ongoing activity.   01/28/19 RBM     Absent goal directed, problem solving behavior.   01/28/19 RBM     Often demonstrates inappropriate use of objects without external direction.  01/28/19 RBM     May be able to perform previously learned tasks when structure and cues provided.       Unable to learn new information       Able to respond appropriately to simple commands fairly consistently with external structure and cues.  01/28/19 RBM     Responses to simple commands without external structure are random and non-purposeful in relation to the command  01/28/19 RBM     Able to converse on a social, automatic level for brief periods of time when provided external structure and cues.        Verbalizations about present events become inappropriate and confabulatory when external structure and cues are not provided.   01/28/19 RBM             Level of function Level VI Confused/Appropriate  Maximal Assistance Behavioral Characteristics Initial Eval.  Date: _____ Date and initial when observed    Able to attend to highly familiar tasks in  non-distracting environment for 30 minutes with moderate redirection.       Remote memory has more depth and detail than recent memory        Vague recognition of some staff.       Able to use assistive memory aide with maximal assist.       Emerging awareness of appropriate response to self, family and basic needs.    LL 2/10    Emerging goal directed behavior.    LL 2/10    Moderate assist to problem solve barriers to task completion.        Supervised for old learning (e.g. self care).       Shows carry over for relearned familiar tasks (e.g. self care).       Maximal assistance for new learning with little or no carry over.       Unaware of impairments, disabilities and safety risks.   LL 2/10    Consistently follows simple directions   LL 2/12    Verbal expressions are appropriate in highly familiar and structured situations.       DO  NOT SIGN NOTE. ALWAYS SHARE UNLESS PATIENT HAS PROGRESSED BEYOND A RANCHO LEVEL VI. THIS WAY, ALL TEAM MEMBERS HAVE ACCESS TO DOCUMENT ON TRACKING SHEET. THANK YOU.
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
## 2019-01-28 NOTE — Progress Notes (Signed)
Patient ID: Robert Norris, male   DOB: 05-17-1971, 48 y.o.   MRN: [id number] I spoke with his mother at the bedside.  Violeta Gelinas, MD, MPH, FACS Trauma: [phone] General Surgery: [phone]

## 2019-01-28 NOTE — Anesthesia Procedure Notes (Signed)
Procedure Name: Intubation Date/Time: 01/28/2019 3:55 PM Performed by: Leonor Liv, CRNA Pre-anesthesia Checklist: Patient identified, Emergency Drugs available, Suction available and Patient being monitored Patient Re-evaluated:Patient Re-evaluated prior to induction Oxygen Delivery Method: Circle System Utilized Preoxygenation: Pre-oxygenation with 100% oxygen Induction Type: IV induction Ventilation: Mask ventilation without difficulty Laryngoscope Size: Mac and 4 Grade View: Grade II Tube type: Oral Tube size: 7.5 mm Number of attempts: 1 Airway Equipment and Method: Stylet and Oral airway Placement Confirmation: ETT inserted through vocal cords under direct vision,  positive ETCO2 and breath sounds checked- equal and bilateral Secured at: 22 cm Tube secured with: Tape Dental Injury: Teeth and Oropharynx as per pre-operative assessment

## 2019-01-28 NOTE — H&P (Signed)
Robert Norris is an 48 y.o. male.   Chief Complaint: Nasal fracture HPI: The patient has improved some over the past two weeks.  He does not engage in conversation.  His facial bruising has improved.  He has a nasal fracture with asymmetry.  Past Medical History:  Diagnosis Date  . GERD (gastroesophageal reflux disease)   . Heart attack (HCC)   . Hyperlipidemia     Past Surgical History:  Procedure Laterality Date  . CORONARY ANGIOPLASTY WITH STENT PLACEMENT     at [facility]    Family History  Problem Relation Age of Onset  . Hypertension Mother   . Colon cancer Maternal Grandmother   . Diabetes Paternal Grandmother    Social History:  reports current alcohol use. No history on file for tobacco and drug.  Allergies: No Known Allergies  Medications Prior to Admission  Medication Sig Dispense Refill  . atorvastatin (LIPITOR) 20 MG tablet Take 20 mg by mouth at bedtime.    . Lansoprazole (PREVACID PO) Take 1 tablet by mouth daily.    . metoCLOPramide (REGLAN) 10 MG tablet Take 10 mg by mouth 3 (three) times daily before meals.      Results for orders placed or performed during the hospital encounter of 01/12/19 (from the past 48 hour(s))  CBC     Status: Abnormal   Collection Time: 01/27/19 11:44 AM  Result Value Ref Range   WBC 8.7 4.0 - 10.5 K/uL   RBC 4.57 4.22 - 5.81 MIL/uL   Hemoglobin 14.0 13.0 - 17.0 g/dL   HCT 75.1 02.5 - 85.2 %   MCV 93.0 80.0 - 100.0 fL   MCH 30.6 26.0 - 34.0 pg   MCHC 32.9 30.0 - 36.0 g/dL   RDW 77.8 24.2 - 35.3 %   Platelets 567 (H) 150 - 400 K/uL   nRBC 0.0 0.0 - 0.2 %    Comment: Performed at [facility], [address], [place], [place] [address]  Glucose, capillary     Status: Abnormal   Collection Time: 01/28/19 12:12 PM  Result Value Ref Range   Glucose-Capillary 136 (H) 70 - 99 mg/dL  Glucose, capillary     Status: None   Collection Time: 01/28/19  2:46 PM  Result Value Ref Range   Glucose-Capillary 91 70 - 99 mg/dL    No results found.  Review of Systems  Constitutional: Negative.   Eyes: Negative.   Respiratory: Negative.   Cardiovascular: Negative.   Gastrointestinal: Negative.   Musculoskeletal: Negative.     Blood pressure (!) 117/96, pulse (!) 116, temperature 98.1 F (36.7 C), temperature source Oral, resp. rate 15, height 5\' 9"  (1.753 m), weight 73.1 kg, SpO2 97 %. Physical Exam  Constitutional: He appears well-developed.  HENT:  Head: Normocephalic.  Eyes: EOM are normal.  Cardiovascular: Normal rate.  Respiratory: Effort normal.  GI: Soft.  Neurological: He is alert.  Skin: Skin is warm.  Psychiatric: He has a normal mood and affect.     Assessment/Plan Plan for closed nasal fracture repair and splinting.  Alena Bills Okey Zelek, DO 01/28/2019, 2:59 PM

## 2019-01-28 NOTE — Progress Notes (Signed)
Pt has been denied admission to [facility] in [place] due to no bed availability.  I have faxed updated progress notes and therapy session notes to [facility], in hopes that they will be able to consider admission, pending insurance approval.    Will follow with updates as they are available.    Quintella Baton, RN, BSN  Trauma/Neuro ICU Case Manager [phone]
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
## 2019-01-28 NOTE — Transfer of Care (Signed)
Immediate Anesthesia Transfer of Care Note  Patient: Robert Norris  Procedure(s) Performed: CLOSED REDUCTION NASAL FRACTURE (N/A Nose)  Patient Location: PACU  Anesthesia Type:General  Level of Consciousness: sedated  Airway & Oxygen Therapy: Patient Spontanous Breathing and Patient connected to face mask oxygen  Post-op Assessment: Report given to RN, Post -op Vital signs reviewed and stable and Patient moving all extremities  Post vital signs: Reviewed and stable  Last Vitals:  Vitals Value Taken Time  BP 130/100 01/28/2019  4:43 PM  Temp    Pulse 93 01/28/2019  4:47 PM  Resp 20 01/28/2019  4:47 PM  SpO2 100 % 01/28/2019  4:47 PM  Vitals shown include unvalidated device data.  Last Pain:  Vitals:   01/28/19 1200  TempSrc: Oral  PainSc:       Patients Stated Pain Goal: 2 (01/27/19 2334)  Complications: No apparent anesthesia complications
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
## 2019-01-28 NOTE — Plan of Care (Signed)
  Problem: Education: Goal: Knowledge of General Education information will improve Description Including pain rating scale, medication(s)/side effects and non-pharmacologic comfort measures Outcome: Progressing   Problem: Clinical Measurements: Goal: Ability to maintain clinical measurements within normal limits will improve Outcome: Progressing Goal: Will remain free from infection Outcome: Progressing Goal: Diagnostic test results will improve Outcome: Progressing Goal: Respiratory complications will improve Outcome: Progressing Goal: Cardiovascular complication will be avoided Outcome: Progressing   Problem: Activity: Goal: Risk for activity intolerance will decrease Outcome: Progressing   Problem: Elimination: Goal: Will not experience complications related to bowel motility Outcome: Progressing Goal: Will not experience complications related to urinary retention Outcome: Progressing   Problem: Pain Managment: Goal: General experience of comfort will improve Outcome: Progressing   
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
## 2019-01-29 ENCOUNTER — Encounter (HOSPITAL_COMMUNITY): Payer: Self-pay | Admitting: Plastic Surgery
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
## 2019-01-29 NOTE — Progress Notes (Signed)
During morning assessment patient is lethargic. Responds to voice (opening eyes) but non verbal and immediately goes back to sleep.   Patient to lethargic to eat.  Nurse spoke with Nurse Tech about reassessing patient in order to feed him.

## 2019-01-29 NOTE — Anesthesia Postprocedure Evaluation (Signed)
Anesthesia Post Note  Patient: Robert Norris  Procedure(s) Performed: CLOSED REDUCTION NASAL FRACTURE (N/A Nose)     Patient location during evaluation: PACU Anesthesia Type: General Level of consciousness: awake and alert Pain management: pain level controlled Vital Signs Assessment: post-procedure vital signs reviewed and stable Respiratory status: spontaneous breathing, nonlabored ventilation, respiratory function stable and patient connected to nasal cannula oxygen Cardiovascular status: blood pressure returned to baseline and stable Postop Assessment: no apparent nausea or vomiting Anesthetic complications: no    Last Vitals:  Vitals:   01/29/19 0753 01/29/19 1136  BP: 131/88 134/89  Pulse: 91 (!) 109  Resp:    Temp: 36.6 C 36.8 C  SpO2: 99% 100%    Last Pain:  Vitals:   01/29/19 0753  TempSrc: Oral  PainSc:                  Phillips Grout

## 2019-01-29 NOTE — Progress Notes (Signed)
Patient mom called for an update.  Concerned about patient not having breakfast.  Nurse explained that patient wakes up but goes back to sleep. Nurse went back in and patient was still lethargic.
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
## 2019-01-30 NOTE — Progress Notes (Signed)
Patient [facility] to [facility] on Tuesday 02/03/2019.
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
## 2019-01-31 NOTE — Progress Notes (Signed)
Pt attempting to get OOB almost constantly. Medicated with Tylenol for general comfort though he denies pain as much as can be assessed. He has attempted to pull out Left UE midline and had pulled 4-5 condom caths off. Ativan 0.5 mg given IV but it has not shown much difference in his restlessness and attempts to get OOB or out of chair. Ambulated around unit with 2 assist after Tylenol and Ativan given without good results for restlessness. Asks for mitts to be removed and will try to use teeth to remove them even when RN is sitting in room with him. Taking po fluids well but appetite is inconsistent. Recognized his aunt by name but was unsure of who son was at first. High fall risk. Gabriel CirriBarbie Mailey Landstrom RN
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
## 2019-02-01 NOTE — Plan of Care (Signed)
  Problem: Education: Goal: Knowledge of General Education information will improve Description Including pain rating scale, medication(s)/side effects and non-pharmacologic comfort measures Outcome: Progressing   Problem: Clinical Measurements: Goal: Ability to maintain clinical measurements within normal limits will improve Outcome: Progressing Goal: Will remain free from infection Outcome: Progressing Goal: Diagnostic test results will improve Outcome: Progressing Goal: Respiratory complications will improve Outcome: Progressing Goal: Cardiovascular complication will be avoided Outcome: Progressing   Problem: Activity: Goal: Risk for activity intolerance will decrease Outcome: Progressing   Problem: Elimination: Goal: Will not experience complications related to bowel motility Outcome: Progressing Goal: Will not experience complications related to urinary retention Outcome: Progressing   Problem: Pain Managment: Goal: General experience of comfort will improve Outcome: Progressing
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
## 2019-02-03 MED ORDER — LORAZEPAM 0.5 MG PO TABS
0.5000 mg | ORAL_TABLET | Freq: Four times a day (QID) | ORAL | Status: DC | PRN
  Administered 2019-02-05 – 2019-02-08 (×3): 1 mg via ORAL
  Administered 2019-02-08: 0.5 mg via ORAL
  Administered 2019-02-11 – 2019-02-18 (×15): 1 mg via ORAL
  Filled 2019-02-03 (×4): qty 2
  Filled 2019-02-03: qty 1
  Filled 2019-02-03 (×14): qty 2

## 2019-02-03 NOTE — Progress Notes (Signed)
Patient transferred from bed to chair after multiple attempts to get out of bed without assistance.  Alarm belt placed on patient but patient is able to maneuver out of the belt without the alarm sounding. No safety sitters are available at this time.  Patient is being watched closely and redirected frequently in attempts to prevent fall.
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
## 2019-02-03 NOTE — Progress Notes (Signed)
I have been notified by Alesia Banda at [facility] that they will not be accepting pt.  [facility] in [place] does not have beds, and are not anticipating bed availability for pt.  CIR admissions liaison attempted to work out a single case agreement with pt's insurance for [facility], but unfortunately, this is not feasible.  Spoke with pt's son, Domingo Dimes, and mother, Steward Drone, to discuss SNF backup plan.  They are agreeable to SNF for rehab, and prefer [place] or [facility] for SN[facility].  Will prepare FL2 and fax out accordingly.    Quintella Baton, RN, BSN  Trauma/Neuro ICU Case Manager [phone]
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
## 2019-02-03 NOTE — Progress Notes (Signed)
SLP Cancellation Note  Patient Details Name: Robert Norris MRN: [id number] DOB: [birth year]   Cancelled treatment:        Received Ativan an hour ago- pt unable to keep eyes open when attempted. Will follow. Will attempt thin liquids at bedside next session (thin recommended after MBS however showing signs of aspiration therefore downgraded to thick liquids- will repeat instrumental when needed when appropriate).    [name] 02/03/2019, 10:58 AM   [name] [name].Ed Nurse, children's [phone] Office [phone]

## 2019-02-03 NOTE — Social Work (Signed)
Pt still not appropriate for SBIRT, RNCM let CSW know pt in need of SNF placement.  PASRR pending, MD aware 30 day note on hard chart to be signed. Referrals sent out to area SNFs.  CSW continuing to follow for support with disposition when medically appropriate.  Octavio Graves, MSW, [facility] Clinical Social Work [phone]

## 2019-02-03 NOTE — Progress Notes (Signed)
Patient ripped out midline from left upper arm.  Barnetta Chapel, PA advised.  Tresa Endo will change Ativan order to PO rather than attempting to place another line.
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
## 2019-02-04 MED ORDER — CLONAZEPAM 0.5 MG PO TABS
0.5000 mg | ORAL_TABLET | Freq: Two times a day (BID) | ORAL | Status: DC
  Administered 2019-02-04 – 2019-02-18 (×30): 0.5 mg via ORAL
  Filled 2019-02-04 (×29): qty 1

## 2019-02-04 MED ORDER — QUETIAPINE FUMARATE 100 MG PO TABS
100.0000 mg | ORAL_TABLET | Freq: Every evening | ORAL | Status: DC | PRN
  Administered 2019-02-05 – 2019-02-17 (×13): 100 mg via ORAL
  Filled 2019-02-04 (×13): qty 1

## 2019-02-04 MED ORDER — ENSURE ENLIVE PO LIQD
237.0000 mL | Freq: Every day | ORAL | Status: DC
  Administered 2019-02-04 – 2019-02-18 (×13): 237 mL via ORAL
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
## 2019-02-04 NOTE — Social Work (Signed)
PASRR received: [id number]  Valid through 03/06/2019  Octavio Graves, MSW, [facility] Clinical Social Work [phone]
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
## 2019-02-05 NOTE — Plan of Care (Signed)
  Problem: Education: Goal: Knowledge of General Education information will improve Description Including pain rating scale, medication(s)/side effects and non-pharmacologic comfort measures Outcome: Progressing   Problem: Clinical Measurements: Goal: Ability to maintain clinical measurements within normal limits will improve Outcome: Progressing Goal: Will remain free from infection Outcome: Progressing Goal: Diagnostic test results will improve Outcome: Progressing Goal: Respiratory complications will improve Outcome: Progressing Goal: Cardiovascular complication will be avoided Outcome: Progressing   Problem: Activity: Goal: Risk for activity intolerance will decrease Outcome: Progressing   Problem: Elimination: Goal: Will not experience complications related to bowel motility Outcome: Progressing Goal: Will not experience complications related to urinary retention Outcome: Progressing   Problem: Pain Managment: Goal: General experience of comfort will improve Outcome: Progressing   
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
## 2019-02-07 NOTE — Progress Notes (Signed)
    10 Days Post-Op  Subjective: CC: No new complaints No new issues or complaints. Awake, only orientated to self.   Objective: Vital signs in last 24 hours: Temp:  [97.5 F (36.4 C)-98.4 F (36.9 C)] 98.4 F (36.9 C) (02/08 0942) Pulse Rate:  [82-97] 97 (02/08 0942) Resp:  [18] 18 (02/08 0942) BP: (107-128)/(79-98) 114/82 (02/08 0942) SpO2:  [96 %-100 %] 96 % (02/08 0942) Last BM Date: 02/06/19  Intake/Output from previous day: 02/07 0701 - 02/08 0700 In: 480 [P.O.:480] Out: -  Intake/Output this shift: No intake/output data recorded.  PE: Gen: awake, only orientated to self. Heart: regular Lungs: CTAB Abd: soft, NT, ND, +BS Ext: MAE  Lab Results:  No results for input(s): WBC, HGB, HCT, PLT in the last 72 hours. BMET No results for input(s): NA, K, CL, CO2, GLUCOSE, BUN, CREATININE, CALCIUM in the last 72 hours. PT/INR No results for input(s): LABPROT, INR in the last 72 hours. CMP     Component Value Date/Time   NA 136 01/26/2019 0901   K 4.1 01/26/2019 0901   CL 100 01/26/2019 0901   CO2 24 01/26/2019 0901   GLUCOSE 144 (H) 01/26/2019 0901   BUN 25 (H) 01/26/2019 0901   CREATININE 0.87 01/26/2019 0901   CALCIUM 9.9 01/26/2019 0901   PROT 7.1 01/12/2019 0048   ALBUMIN 4.1 01/12/2019 0048   AST 23 01/12/2019 0048   ALT 20 01/12/2019 0048   ALKPHOS 51 01/12/2019 0048   BILITOT 0.6 01/12/2019 0048   GFRNONAA >60 01/26/2019 0901   GFRAA >60 01/26/2019 0901   Lipase  No results found for: LIPASE     Studies/Results: No results found.  Anti-infectives: Anti-infectives (From admission, onward)   Start     Dose/Rate Route Frequency Ordered Stop   01/29/19 0600  ceFAZolin (ANCEF) IVPB 2g/100 mL premix     2 g 200 mL/hr over 30 Minutes Intravenous On call to O.R. 01/28/19 1514 01/28/19 1543   01/28/19 1519  ceFAZolin (ANCEF) 2-4 GM/100ML-% IVPB    Note to Pharmacy:  [name], [name]   : cabinet override      01/28/19 1519 01/28/19 1543   01/27/19 1000  azithromycin (ZITHROMAX) tablet 250 mg     250 mg Oral Daily 01/26/19 1519 01/30/19 1219   01/26/19 1530  azithromycin (ZITHROMAX) tablet 500 mg     500 mg Oral Daily 01/26/19 1519 01/26/19 1638   01/19/19 0800  ceFEPIme (MAXIPIME) 1 g in sodium chloride 0.9 % 100 mL IVPB     1 g 200 mL/hr over 30 Minutes Intravenous Every 8 hours 01/19/19 0733 01/23/19 0001   01/16/19 1000  ceFEPIme (MAXIPIME) 1 g in sodium chloride 0.9 % 100 mL IVPB  Status:  Discontinued     1 g 200 mL/hr over 30 Minutes Intravenous Every 12 hours 01/16/19 0929 01/19/19 0733       [facility] SD[facility]/ICC- per Dr. Yetta BarreJones, TBI team therapies, low dose klonopin and prn qhs Seroquel to help with some agitation, seems improved to some extent Nasal FX and large facial abrasions- s/pclosed reduction,Dr. Ulice Boldillingham, 01/29 ETOH abuse disorder- ETOH 280 on admit, CIWA Cocaine abuse- CSW eval  FEN-DYS I diet VTE- PAS, lovenox ID/Fever- afebrile, WBC 8k1/28;Z-pack forpossible sinus infection01/27-01/31  Dispo- PT/OT/ST. SNF. Stable for discharge when bed available.   LOS: 26 days    Robert HalimMichael M Alesa Norris , [facility] 02/07/2019, 10:05 AM Pager: [phone]

## 2019-02-08 NOTE — Progress Notes (Signed)
    11 Days Post-Op  Subjective: CC: No new complaints No new issues or complaints. Sleeping, but easily awoken. Only orientated to self.   Objective: Vital signs in last 24 hours: Temp:  [97.9 F (36.6 C)-99.5 F (37.5 C)] 98.5 F (36.9 C) (02/08 1900) Pulse Rate:  [86-97] 86 (02/08 1900) Resp:  [18] 18 (02/08 0942) BP: (107-114)/(72-90) 113/90 (02/08 1900) SpO2:  [96 %-98 %] 98 % (02/08 1900) Last BM Date: 02/07/19  Intake/Output from previous day: 02/08 0701 - 02/09 0700 In: 118 [P.O.:118] Out: -  Intake/Output this shift: No intake/output data recorded.  PE: Gen: awake, only orientated to self. Heart: regular Lungs: CTAB Abd: soft, NT, ND, +BS Ext: MAE  Lab Results:  No results for input(s): WBC, HGB, HCT, PLT in the last 72 hours. BMET No results for input(s): NA, K, CL, CO2, GLUCOSE, BUN, CREATININE, CALCIUM in the last 72 hours. PT/INR No results for input(s): LABPROT, INR in the last 72 hours. CMP     Component Value Date/Time   NA 136 01/26/2019 0901   K 4.1 01/26/2019 0901   CL 100 01/26/2019 0901   CO2 24 01/26/2019 0901   GLUCOSE 144 (H) 01/26/2019 0901   BUN 25 (H) 01/26/2019 0901   CREATININE 0.87 01/26/2019 0901   CALCIUM 9.9 01/26/2019 0901   PROT 7.1 01/12/2019 0048   ALBUMIN 4.1 01/12/2019 0048   AST 23 01/12/2019 0048   ALT 20 01/12/2019 0048   ALKPHOS 51 01/12/2019 0048   BILITOT 0.6 01/12/2019 0048   GFRNONAA >60 01/26/2019 0901   GFRAA >60 01/26/2019 0901   Lipase  No results found for: LIPASE     Studies/Results: No results found.  Anti-infectives: Anti-infectives (From admission, onward)   Start     Dose/Rate Route Frequency Ordered Stop   01/29/19 0600  ceFAZolin (ANCEF) IVPB 2g/100 mL premix     2 g 200 mL/hr over 30 Minutes Intravenous On call to O.R. 01/28/19 1514 01/28/19 1543   01/28/19 1519  ceFAZolin (ANCEF) 2-4 GM/100ML-% IVPB    Note to Pharmacy:  Sandi Raveling   : cabinet override      01/28/19 1519  01/28/19 1543   01/27/19 1000  azithromycin (ZITHROMAX) tablet 250 mg     250 mg Oral Daily 01/26/19 1519 01/30/19 1219   01/26/19 1530  azithromycin (ZITHROMAX) tablet 500 mg     500 mg Oral Daily 01/26/19 1519 01/26/19 1638   01/19/19 0800  ceFEPIme (MAXIPIME) 1 g in sodium chloride 0.9 % 100 mL IVPB     1 g 200 mL/hr over 30 Minutes Intravenous Every 8 hours 01/19/19 0733 01/23/19 0001   01/16/19 1000  ceFEPIme (MAXIPIME) 1 g in sodium chloride 0.9 % 100 mL IVPB  Status:  Discontinued     1 g 200 mL/hr over 30 Minutes Intravenous Every 12 hours 01/16/19 0929 01/19/19 0733       Assessment/Plan [facility] TBI/SAH/falcine SDH/ICC- per Dr. Yetta Barre, TBI team therapies, low dose klonopin and prn qhs Seroquel to help with some agitation, seems improved to some extent Nasal FX and large facial abrasions- s/pclosed reduction,Dr. Ulice Bold, 01/29 ETOH abuse disorder- ETOH 280 on admit, CIWA Cocaine abuse- CSW eval  FEN-DYS 2 diet VTE- PAS, lovenox ID/Fever- afebrile, WBC 8k1/28;Z-pack forpossible sinus infection01/27-01/31  Dispo- PT/OT/ST. SNF. Stable for discharge when bed available.   LOS: 27 days    Robert Norris , [facility] 02/08/2019, 8:24 AM Pager: [phone]

## 2019-02-09 NOTE — Progress Notes (Signed)
12 Days Post-Op  Subjective: CC: No new issues/complaints.  Resting comfortably this morning. He wakes up for me while I'm in the room. Expresses no new issues or complaints before going back to sleep.  Objective: Vital signs in last 24 hours: Temp:  [97.7 F (36.5 C)-99 F (37.2 C)] 99 F (37.2 C) (02/09 1629) Pulse Rate:  [77-97] 97 (02/09 1629) Resp:  [18-20] 20 (02/09 1629) BP: (92-128)/(69-95) 118/82 (02/09 1629) SpO2:  [99 %] 99 % (02/09 1629) Last BM Date: 02/08/19  Intake/Output from previous day: 02/09 0701 - 02/10 0700 In: 360 [P.O.:360] Out: -  Intake/Output this shift: No intake/output data recorded.  PE: Gen: resting Heart: regular Lungs: CTAB Abd: soft, NT, ND, +BS Ext: MAE  Lab Results:  No results for input(s): WBC, HGB, HCT, PLT in the last 72 hours. BMET No results for input(s): NA, K, CL, CO2, GLUCOSE, BUN, CREATININE, CALCIUM in the last 72 hours. PT/INR No results for input(s): LABPROT, INR in the last 72 hours. CMP     Component Value Date/Time   NA 136 01/26/2019 0901   K 4.1 01/26/2019 0901   CL 100 01/26/2019 0901   CO2 24 01/26/2019 0901   GLUCOSE 144 (H) 01/26/2019 0901   BUN 25 (H) 01/26/2019 0901   CREATININE 0.87 01/26/2019 0901   CALCIUM 9.9 01/26/2019 0901   PROT 7.1 01/12/2019 0048   ALBUMIN 4.1 01/12/2019 0048   AST 23 01/12/2019 0048   ALT 20 01/12/2019 0048   ALKPHOS 51 01/12/2019 0048   BILITOT 0.6 01/12/2019 0048   GFRNONAA >60 01/26/2019 0901   GFRAA >60 01/26/2019 0901   Lipase  No results found for: LIPASE     Studies/Results: No results found.  Anti-infectives: Anti-infectives (From admission, onward)   Start     Dose/Rate Route Frequency Ordered Stop   01/29/19 0600  ceFAZolin (ANCEF) IVPB 2g/100 mL premix     2 g 200 mL/hr over 30 Minutes Intravenous On call to O.R. 01/28/19 1514 01/28/19 1543   01/28/19 1519  ceFAZolin (ANCEF) 2-4 GM/100ML-% IVPB    Note to Pharmacy:  Sandi Raveling   :  cabinet override      01/28/19 1519 01/28/19 1543   01/27/19 1000  azithromycin (ZITHROMAX) tablet 250 mg     250 mg Oral Daily 01/26/19 1519 01/30/19 1219   01/26/19 1530  azithromycin (ZITHROMAX) tablet 500 mg     500 mg Oral Daily 01/26/19 1519 01/26/19 1638   01/19/19 0800  ceFEPIme (MAXIPIME) 1 g in sodium chloride 0.9 % 100 mL IVPB     1 g 200 mL/hr over 30 Minutes Intravenous Every 8 hours 01/19/19 0733 01/23/19 0001   01/16/19 1000  ceFEPIme (MAXIPIME) 1 g in sodium chloride 0.9 % 100 mL IVPB  Status:  Discontinued     1 g 200 mL/hr over 30 Minutes Intravenous Every 12 hours 01/16/19 0929 01/19/19 0733       Assessment/Plan [facility] TBI/SAH/falcine SDH/ICC- per Dr. Yetta Barre, TBI team therapies, low dose klonopin and prn qhs Seroquel to help with some agitation, seems improved to some extent Nasal FX and large facial abrasions- s/pclosed reduction,Dr. Ulice Bold, 01/29 ETOH abuse disorder- ETOH 280 on admit, CIWA Cocaine abuse- CSW eval  FEN-DYS 2 diet VTE- PAS, lovenox ID/Fever- afebrile, WBC 8k1/28;Z-pack forpossible sinus infection01/27-01/31  Dispo- PT/OT/ST. SNF. Stable for discharge when bed available.   LOS: 28 days    Jacinto Halim , [facility] 02/09/2019, 7:47 AM Pager:  [phone]  
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
## 2019-02-09 NOTE — Progress Notes (Signed)
Robert Norris has accepted the patient for possible SNF placement.   CSW will continue to follow.   Drucilla Schmidt, MSW, LCSW-A Clinical Social Worker [facility]

## 2019-02-09 NOTE — Progress Notes (Signed)
CSW received a phone call back from the patients mother. CSW explained that Robert Norris is able to accept her son for skilled nursing. CSW provided SNF address and phone number.   CSW will continue to follow.   Drucilla Schmidt, MSW, LCSW-A Clinical Social Worker [facility] [employer]

## 2019-02-09 NOTE — Progress Notes (Signed)
CSW called and left a voicemail for patients mother stating that Robert Norris has accepted her son for a skilled nursing placement.   CSW will continue to follow.   Drucilla Schmidt, MSW, LCSW-A Clinical Social Worker [facility] [employer]
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
## 2019-02-12 NOTE — Plan of Care (Signed)
  Problem: Education: Goal: Knowledge of General Education information will improve Description Including pain rating scale, medication(s)/side effects and non-pharmacologic comfort measures Outcome: Progressing   Problem: Clinical Measurements: Goal: Ability to maintain clinical measurements within normal limits will improve Outcome: Progressing Goal: Will remain free from infection Outcome: Progressing Goal: Diagnostic test results will improve Outcome: Progressing Goal: Respiratory complications will improve Outcome: Progressing Goal: Cardiovascular complication will be avoided Outcome: Progressing   Problem: Activity: Goal: Risk for activity intolerance will decrease Outcome: Progressing   Problem: Elimination: Goal: Will not experience complications related to bowel motility Outcome: Progressing Goal: Will not experience complications related to urinary retention Outcome: Progressing   Problem: Pain Managment: Goal: General experience of comfort will improve Outcome: Progressing   

## 2019-02-13 ENCOUNTER — Other Ambulatory Visit: Payer: Self-pay

## 2019-02-13 ENCOUNTER — Encounter (HOSPITAL_COMMUNITY): Payer: Self-pay

## 2019-02-13 NOTE — Plan of Care (Signed)
  Problem: Education: Goal: Knowledge of General Education information will improve Description: Including pain rating scale, medication(s)/side effects and non-pharmacologic comfort measures Outcome: Progressing   Problem: Clinical Measurements: Goal: Ability to maintain clinical measurements within normal limits will improve Outcome: Progressing Goal: Will remain free from infection Outcome: Progressing   
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
## 2019-02-14 ENCOUNTER — Encounter (HOSPITAL_COMMUNITY): Payer: Self-pay
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
## 2019-02-14 NOTE — Plan of Care (Signed)
  Problem: Education: Goal: Knowledge of General Education information will improve Description Including pain rating scale, medication(s)/side effects and non-pharmacologic comfort measures 02/14/2019 0505 by Vanetta Shawl, RN Outcome: Progressing 02/13/2019 2250 by Vanetta Shawl, RN Outcome: Progressing   Problem: Clinical Measurements: Goal: Ability to maintain clinical measurements within normal limits will improve 02/14/2019 0505 by Vanetta Shawl, RN Outcome: Progressing 02/13/2019 2250 by Vanetta Shawl, RN Outcome: Progressing Goal: Will remain free from infection 02/14/2019 0505 by Vanetta Shawl, RN Outcome: Progressing 02/13/2019 2250 by Vanetta Shawl, RN Outcome: Progressing   Problem: Clinical Measurements: Goal: Will remain free from infection 02/14/2019 0505 by Vanetta Shawl, RN Outcome: Progressing 02/13/2019 2250 by Vanetta Shawl, RN Outcome: Progressing

## 2019-02-15 ENCOUNTER — Encounter (HOSPITAL_COMMUNITY): Payer: Self-pay
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
## 2019-02-16 NOTE — Social Work (Signed)
Pt ambulating supervision 500+ feet. Pt without any bed offers.  CSW continuing to follow for support with disposition will consult dept leadership for guidance.  Octavio Graves, MSW, [facility] Clinical Social Work [phone]
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
## 2019-02-16 NOTE — Discharge Instructions (Addendum)
Head Injury, Adult There are many types of head injuries. They can be as minor as a bump. Some head injuries can be worse. Worse injuries include:  A strong hit to the head that hurts the brain (concussion).  A bruise of the brain (contusion). This means there is bleeding in the brain that can cause swelling.  A cracked skull (skull fracture).  Bleeding in the brain that gathers, gets thick (makes a clot), and forms a bump (hematoma). Most problems from a head injury come in the first 24 hours. However, you may still have side effects up to 7-10 days after your injury. It is important to watch your condition for any changes. Follow these instructions at home: Activity  Rest as much as possible.  Avoid activities that are hard or tiring.  Make sure you get enough sleep.  Limit activities that need a lot of thought or attention, such as: ? Watching TV. ? Playing memory games and puzzles. ? Job-related work or homework. ? Working on [employer], and texting.  Avoid activities that could cause another head injury until your doctor says it is okay. This includes playing sports.  Ask your doctor when it is safe for you to go back to your normal activities, such as work or school. Ask your doctor for a step-by-step plan for slowly going back to your normal activities.  Ask your doctor when you can drive, ride a bicycle, or use heavy machinery. Never do these activities if you are dizzy. Lifestyle  Do not drink alcohol until your doctor says it is okay.  Avoid drug use.  If it is harder than usual to remember things, write them down.  If you are easily distracted, try to do one thing at a time.  Talk with family members or close friends when making important decisions.  Tell your friends, family, a trusted coworker, and work Production designer, theatre/television/film about your injury, symptoms, and limits (restrictions). Have them watch for any problems that are new or getting worse. General  instructions  Take over-the-counter and prescription medicines only as told by your doctor.  Have someone stay with you for 24 hours after your head injury. This person should watch you for any changes in your symptoms and be ready to get help.  Keep all follow-up visits as told by your doctor. This is important. How is this prevented?  Work on Therapist, music. This can help you avoid falls.  Wear a seatbelt when you are in a moving vehicle.  Wear a helmet when: ? Riding a bicycle. ? Skiing. ? Doing any other sport or activity that has a risk of injury.  Drink alcohol only in moderation.  Make your home safer by: ? Getting rid of clutter from the floors and stairs, like things that can make you trip. ? Using grab bars in bathrooms and handrails by stairs. ? Placing non-slip mats on floors and in bathtubs. ? Putting more light in dim areas. Get help right away if:  You have: ? A very bad (severe) headache that is not helped by medicine. ? Trouble walking or weakness in your arms and legs. ? Clear or bloody fluid coming from your nose or ears. ? Changes in your seeing (vision). ? Jerky movements that you cannot control (seizure).  You throw up (vomit).  Your symptoms get worse.  You lose balance.  Your speech is slurred.  You pass out.  You are sleepier and have trouble staying awake.  The  black centers of your eyes (pupils) change in size. These symptoms may be an emergency. Do not wait to see if the symptoms will go away. Get medical help right away. Call your local emergency services (911 in the [place] Do not drive yourself to the hospital. This information is not intended to replace advice given to you by your health care provider. Make sure you discuss any questions you have with your health care provider. Document Released: 11/29/2008 Document Revised: 09/10/2018 Document Reviewed: 06/26/2016 Elsevier Interactive Patient Education  2019 Elsevier  Inc.    Dysphagia Eating Plan, Bite Size Food This diet plan is for people with moderate swallowing problems who have transitioned from pureed and minced foods. Bite size foods are soft and cut into small chunks so that they can be swallowed safely. On this eating plan, you may be instructed to drink liquids that are thickened. Work with your health care provider and your diet and nutrition specialist (dietitian) to make sure that you are following the diet safely and getting all the nutrients you need. What are tips for following this plan? General guidelines for foods   You may eat foods that are tender, soft, and moist.  Always test food texture before taking a bite. Poke food with a fork or spoon to make sure it is tender.  Food should be easy to cut and shew. Avoid large pieces of food that require a lot of chewing.  Take small bites. Each bite should be smaller than your thumb nail (about 24mm by 15 mm).  If you were on pureed and minced food diet plans, you may eat any of the foods included in those diets.  Avoid foods that are very dry, hard, sticky, chewy, coarse, or crunchy.  If instructed by your health care provider, thicken liquids. Follow your health care provider's instructions for what products to use, how to do this, and to what thickness. ? Your health care provider may recommend using a commercial thickener, rice cereal, or potato flakes. Ask your health care provider to recommend thickeners. ? Thickened liquids are usually a pudding-like consistency, or they may be as thick as honey or thick enough to eat with a spoon. Cooking  To moisten foods, you may add liquids while you are blending, mashing, or grinding your foods to the right consistency. These liquids include gravies, sauces, vegetable or fruit juice, milk, half and half, or water.  Strain extra liquid from foods before eating.  Reheat foods slowly to prevent a tough crust from forming.  Prepare foods  in advance. Meal planning  Eat a variety of foods to get all the nutrients you need.  Some foods may be tolerated better than others. Work with your dietitian to identify which foods are safest for you to eat.  Follow your meal plan as told by your dietitian. What foods are allowed? Grains Moist breads without nuts or seeds. Biscuits, muffins, pancakes, and waffles that are well-moistened with syrup, jelly, margarine, or butter. Cooked cereals. Moist bread stuffing. Moist rice. Well-moistened cold cereal with small chunks. Well-cooked pasta, noodles, rice, and bread dressing in small pieces and thick sauce. Soft dumplings or spaetzle in small pieces and butter or gravy. Vegetables Soft, well-cooked vegetables in small pieces. Soft-cooked, mashed potatoes. Thickened vegetable juice. Fruits Canned or cooked fruits that are soft or moist and do not have skin or seeds. Fresh, soft bananas. Thickened fruit juices. Meat and other protein foods Tender, moist meats or poultry in small pieces. Moist meatballs  or meatloaf. Fish without bones. Eggs or egg substitutes in small pieces. Tofu. Tempeh and meat alternatives in small pieces. Well-cooked, tender beans, peas, baked beans, and other legumes. Dairy Thickened milk. Cream cheese. Yogurt. Cottage cheese. Sour cream. Small pieces of soft cheese. Fats and oils Butter. Oils. Margarine. Mayonnaise. Gravy. Spreads. Sweets and desserts Soft, smooth, moist desserts. Pudding. Custard. Moist cakes. Jam. Jelly. Honey. Preserves. Ask your health care provider whether you can have frozen desserts. Seasoning and other foods All seasonings and sweeteners. All sauces with small chunks. Prepared tuna, egg, or chicken salad without raw fruits or vegetables. Moist casseroles with small, tender pieces of meat. Soups with tender meat. What foods are not allowed? Grains Coarse or dry cereals. Dry breads. Toast. Crackers. Tough, crusty breads, such as [place] bread and  baguettes. Dry pancakes, waffles, and muffins. Sticky rice. Dry bread stuffing. Granola. Popcorn. Chips. Vegetables All raw vegetables. Cooked corn. Rubbery or stiff cooked vegetables. Stringy vegetables, such as celery. Tough, crisp fried potatoes. Potato skins. Fruits Hard, crunchy, stringy, high-pulp, and juicy raw fruits such as apples, pineapple, papaya, and watermelon. Small, round fruits, such as grapes. Dried fruit and fruit leather. Meat and other protein foods Large pieces of meat. Dry, tough meats, such as bacon, sausage, and hot dogs. Chicken, Malaw[place]urkey, or fish with skin and bones. Crunchy peanut butter. Nuts. Seeds. Nut and seed butters. Dairy Yogurt with nuts, seeds, or large chunks. Large chunks of cheese. Frozen desserts and milk consistency not allowed by your dietitian. Sweets and desserts Dry cakes. Chewy or dry cookies. Any desserts with nuts, seeds, dry fruits, coconut, pineapple, or anything dry, sticky, or hard. Chewy caramel. Licorice. Taffy-type candies. Ask your health care provider whether you can have frozen desserts. Seasoning and other foods Soups with tough or large chunks of meats, poultry, or vegetables. Corn or clam chowder. Smoothies with large chunks of fruit. Summary  Bite size foods can be helpful for people with moderate swallowing problems.  On the dysphagia eating plan, you may eat foods that are soft, moist, and cut into pieces smaller than 15mm by 15mm.  You may be instructed to thicken liquids. Follow your health care provider's instructions about how to do this and to what consistency. This information is not intended to replace advice given to you by your health care provider. Make sure you discuss any questions you have with your health care provider. Document Released: 12/17/2005 Document Revised: 03/29/2017 Document Reviewed: 03/29/2017 Elsevier Interactive Patient Education  2019 [employer]
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
## 2019-02-16 NOTE — Care Management (Signed)
1619 02-16-19 CM did speak with patient- sitter at the bedside. Pt c/o pain upon meeting. CSW has been working to find SNF bed- unable to do so at this point. CM did reach out to son Domingo Dimes to see if they have explored other options for transitioning out of the hospital. Son states that his father previously lived with him. CM did ask if patient had to come back to his home would there be any support in the home. Son states he needs to reach out to the patients mother to see if she can stay with him during the day. Son-Dylan can be reached at 9:40 am and 12:00 pm hours that he has break @ work. If the plan will be for home- pt will need  supervision-to be deemed safe transition home. Family may need to explore ALF vs Group Homes. CM will continue to monitor. Gala Lewandowsky, RN,BSN Case Manager [phone]
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
## 2019-02-17 NOTE — Progress Notes (Addendum)
1537pm Received call from pt's son, Dylan: he has spoken with pt's mother, and she states she is willing to provide 24h care at dc.  Dylan states he cannot provide care for pt, as he has to work.  Would be beneficial for PT/OT to see pt on 02/18/19 and make recommendations for home discharge.  I will confirm arrangements with pt's mother once recommendations are entered.    Long discussion with pt's mother, Steward Drone, regarding pt disposition.  She and son Domingo Dimes are aware that pt currently has no bed offers for SNF; he is walking 500 feet with PT and making good progress with other therapies.  We discussed possibility of home discharge with 24h supervision; mother states this may be possible, but she will have to talk with Dylan.  Domingo Dimes has expressed that he cannot take care of pt, but did state that he may be willing to have pt discharge back home if pt's mother could stay with him during the day.  Mom feels that pt will improve once discharged home, but understands that he cannot be left alone.  She plans to talk to Domingo Dimes today when he gets off work.    Quintella Baton, RN, BSN  Trauma/Neuro ICU Case Manager [phone]
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
## 2019-02-18 ENCOUNTER — Inpatient Hospital Stay (HOSPITAL_COMMUNITY): Payer: BLUE CROSS/BLUE SHIELD

## 2019-02-18 MED ORDER — QUETIAPINE FUMARATE 100 MG PO TABS: 100.0000 mg | ORAL_TABLET | Freq: Every evening | ORAL | 0 refills | Status: AC | PRN

## 2019-02-18 MED ORDER — POLYETHYLENE GLYCOL 3350 17 G PO PACK: 17.0000 g | PACK | Freq: Every day | ORAL | 0 refills | Status: AC | PRN

## 2019-02-18 MED ORDER — HYDROCODONE-ACETAMINOPHEN 5-325 MG PO TABS: 1.0000 | ORAL_TABLET | Freq: Four times a day (QID) | ORAL | 0 refills | Status: AC | PRN

## 2019-02-18 MED ORDER — CLONAZEPAM 0.5 MG PO TABS: 0.5000 mg | ORAL_TABLET | Freq: Two times a day (BID) | ORAL | 1 refills | Status: AC

## 2019-02-18 MED ORDER — DOCUSATE SODIUM 100 MG PO CAPS: 100.0000 mg | ORAL_CAPSULE | Freq: Two times a day (BID) | ORAL | 0 refills | Status: AC | PRN

## 2019-02-18 MED ORDER — ENSURE ENLIVE PO LIQD: 237.0000 mL | Freq: Every day | ORAL | 12 refills | Status: AC

## 2019-02-18 MED ORDER — TAB-A-VITE/IRON PO TABS: 1.0000 | ORAL_TABLET | Freq: Every day | ORAL | 0 refills | Status: AC
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
## 2019-02-18 NOTE — Social Work (Signed)
CSW acknowledging family will support pt with discharge. Pt still not appropriate for SBIRT at this time due to mental status.  CSW signing off. Please consult if any additional needs arise.  Doy Hutching, LCSWA [facility] Clinical Social Work [phone]
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
## 2019-02-18 NOTE — Progress Notes (Signed)
  Speech Language Pathology  Patient Details Name: Robert Norris MRN: [id number] DOB: April 12, 1971 Today's Date: 02/18/2019 Time:  -      MBS scheduled for 9:30 this morning                [name] 02/18/2019, 8:31 AM     [name] [name].Ed Nurse, children's [phone] Office [phone]
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
## 2019-02-18 NOTE — Progress Notes (Signed)
Pt with d/c orders. Discharge paperwork reviewed with pt and mother and all questions answered. Pt escorted out to vehicle via wheelchair.

## 2019-02-18 NOTE — Progress Notes (Signed)
OT NOTE   Family education information listed below:  OT RECOMMENDATINS FOR HOME . Mattress pad cover to protect mattress due to incontinence . Diaper/ brief for night time due to incontinence o Likes to sleep nude and likely to remove diaper in the middle of the night so mattress pad to protect the bed . Bowel/ bladder plan o Take the patient to the bathroom every 2 hours to decrease incontinence . Upon waking up - go immediately to the bathroom with Brad and have him sit on the toilet with running water  . Must give a quiet environment for all meals (no tv/ minimal talking) . Needs cues to continue the task o [name] will ask "what do you want me to do?" - literally tell him "take a bite"  o See recommendations from speech therapy regarding texture of the food  . Once meal is finished will need hand held assistance to the next location for him to sit in the home . Seating in the home will need a protective barrier due to incontinence  . [name] is unable to locate his room so he will need help getting around the house  . [name] is at risk for opening a door and exiting the home with the inability to return back to the home. All doors should be locked or secured. Marland Kitchen Requires assistance for all bathing and dressing in a seated position o He needs cues to wash your face, wash your arm if not cued he will not wash that body part o He will need help to wash his buttock  . Observation is required even with oral care because brad will brush his teeth without applying the tooth paste to the brush . Requires 24/7 supervision due to fall risk and safety . All medication will have to be provided to [facility] at the correct time/ dosage . Night lights in the home at night to help decrease fall risk with nightly trips to the bathroom  Establishing a routine will help with recovering- same time/ same place-(ex. Breakfast 7am everyday)   Mateo Flow, OTR/L  Acute Rehabilitation Services Pager:  [phone] Office: [phone] .

## 2019-02-18 NOTE — Progress Notes (Signed)
Modified Barium Swallow Progress Note  Patient Details  Name: Robert Norris MRN: [id number] Date of Birth: 03/17/1971  Today's Date: 02/18/2019  Modified Barium Swallow completed.  Full report located under Chart Review in the Imaging Section.  Brief recommendations include the following:  Clinical Impression  Pt's oral and pharyngeal swallow processes were within normal/safe limits during MBS. Orally he contained thin boluses via cup/straw without anterior leakage or premature spill as well as dual consistency of pill and thin. Laryngeal protection was timely and complete preventing penetration or aspiration during study. No post swallow residue or cervical esophageal impairments. Recommend Dys 3 texture given his continued impulsivity and need for cues with cognitive deficits. He is able to upgrade to thin liquids, straws allowed, pills with thin one at at time, small sips FULL supervision. Pt should self feed with cues for initiation etc when needed.        Swallow Evaluation Recommendations       SLP Diet Recommendations: Dysphagia 3 (Mech soft) solids;Thin liquid   Liquid Administration via: Straw;Cup   Medication Administration: (one at at [employer])   Supervision: Patient able to self feed;Full supervision/cueing for compensatory strategies   Compensations: Minimize environmental distractions;Slow rate;Small sips/bites   Postural Changes: Seated upright at 90 degrees   Oral Care Recommendations: Oral care BID        Robert Norris   ,10:08 AM    Robert Norris.Ed Nurse, children's [phone] Office [phone]
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
## 2019-02-19 ENCOUNTER — Encounter: Payer: Self-pay | Admitting: General Surgery
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
## 2020-09-10 IMAGING — DX DG ABD PORTABLE 1V
1 series · 1 of 1 positions shown · non-contrast
Comparison: 01/20/2019

CLINICAL DATA: NG tube placement

EXAM:
PORTABLE ABDOMEN - 1 VIEW

[abdomen]
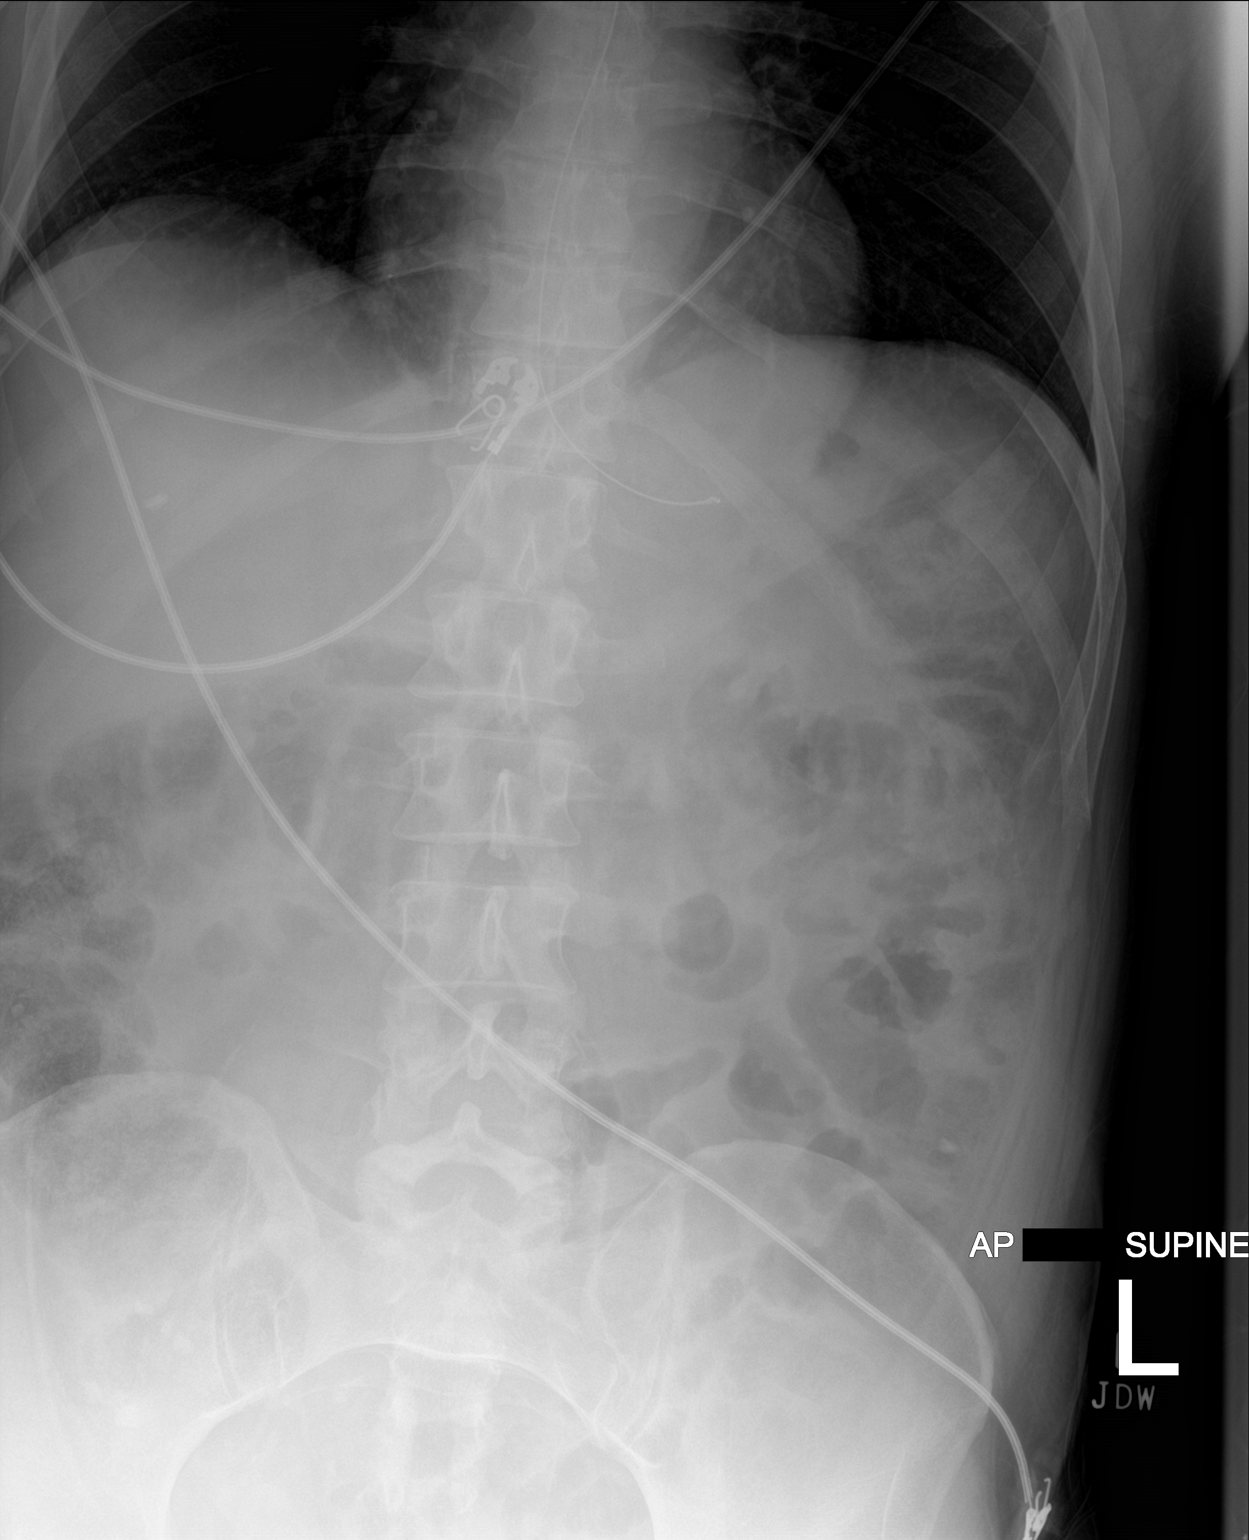

[1 of 1 positions shown; findings below may reference images not displayed]

FINDINGS: Esophageal tube tip overlies the proximal stomach. Side-port likely
over the distal esophagus. Nonobstructed gas pattern
IMPRESSION: Esophageal tube tip overlies the proximal stomach, side-port likely
over the distal esophagus. Suggest further advancement for more
optimal positioning
